# Patient Record
Sex: Male | Born: 1970 | Hispanic: No | Marital: Married | Smoking: Former smoker
Health system: Southern US, Community
[De-identification: ages and names within clinical notes are randomized; demographics above are authoritative.]

## PROBLEM LIST (undated history)

## (undated) DIAGNOSIS — N289 Disorder of kidney and ureter, unspecified: Secondary | ICD-10-CM

## (undated) DIAGNOSIS — I10 Essential (primary) hypertension: Secondary | ICD-10-CM

## (undated) DIAGNOSIS — K219 Gastro-esophageal reflux disease without esophagitis: Secondary | ICD-10-CM

## (undated) DIAGNOSIS — K2970 Gastritis, unspecified, without bleeding: Secondary | ICD-10-CM

## (undated) DIAGNOSIS — G43909 Migraine, unspecified, not intractable, without status migrainosus: Secondary | ICD-10-CM

## (undated) DIAGNOSIS — K8689 Other specified diseases of pancreas: Secondary | ICD-10-CM

## (undated) DIAGNOSIS — R519 Headache, unspecified: Secondary | ICD-10-CM

## (undated) DIAGNOSIS — R51 Headache: Secondary | ICD-10-CM

## (undated) HISTORY — PX: NO PAST SURGERIES: SHX2092

---

## 2014-06-14 ENCOUNTER — Encounter (HOSPITAL_COMMUNITY): Payer: Self-pay | Admitting: *Deleted

## 2014-06-14 ENCOUNTER — Emergency Department (INDEPENDENT_AMBULATORY_CARE_PROVIDER_SITE_OTHER)
Admission: EM | Admit: 2014-06-14 | Discharge: 2014-06-14 | Disposition: A | Discharge status: Home / Self Care | Payer: Medicaid Other | Source: Home / Self Care | Attending: Family Medicine | Admitting: Family Medicine

## 2014-06-14 DIAGNOSIS — N3090 Cystitis, unspecified without hematuria: Secondary | ICD-10-CM

## 2014-06-14 LAB — POCT URINALYSIS DIP (DEVICE)
BILIRUBIN URINE: NEGATIVE
Glucose, UA: NEGATIVE mg/dL
Ketones, ur: NEGATIVE mg/dL
Nitrite: POSITIVE — AB
PH: 5 (ref 5.0–8.0)
Protein, ur: 100 mg/dL — AB
Urobilinogen, UA: 0.2 mg/dL (ref 0.0–1.0)

## 2014-06-14 MED ORDER — CIPROFLOXACIN HCL 500 MG PO TABS: 500.0000 mg | ORAL_TABLET | Freq: Two times a day (BID) | ORAL | Status: DC

## 2014-06-14 NOTE — ED Notes (Signed)
Painful urination and bil. Flank pain x 4 days.  No fever.  From [place] -friend is translating.  No hx of same symptoms.  No hx. of kidney stones or UTI.

## 2014-06-14 NOTE — ED Provider Notes (Signed)
CSN: [id number]     Arrival date & time 06/14/14  1557 History   None    Chief Complaint  Patient presents with  . Urinary Tract Infection   (Consider location/radiation/quality/duration/timing/severity/associated sxs/prior Treatment) Patient is a 43 y.o. male presenting with urinary tract infection.  Urinary Tract Infection Associated symptoms include abdominal pain.           43 year old male presents for evaluation of possible UTI. He has lower abdominal pain, burning with urination, urinary frequency, urinary urgency. He has never had a urinary tract infection in the past but after discussing this with his friend they think that is what he has. Denies any penile discharge. No respiratory. No history of HIV or immune deficiency. He is a recent immigrant from [place]. He had one episode of vomiting 3 days ago, no fever, no diarrhea  History reviewed. No pertinent past medical history. History reviewed. No pertinent past surgical history. Family History  Problem Relation Age of Onset  . Hypertension Mother    History  Substance Use Topics  . Smoking status: Never Smoker   . Smokeless tobacco: Not on file  . Alcohol Use: 3.6 oz/week    6 Cans of beer per week    Review of Systems  Constitutional: Negative for fever and chills.  Gastrointestinal: Positive for vomiting and abdominal pain. Negative for nausea and diarrhea.  Genitourinary: Positive for dysuria, urgency and frequency. Negative for hematuria, discharge, penile swelling, penile pain and testicular pain.  All other systems reviewed and are negative.   Allergies  Review of patient's allergies indicates no known allergies.  Home Medications   Prior to Admission medications   Medication Sig Start Date End Date Taking? Authorizing Provider  ciprofloxacin (CIPRO) 500 MG tablet Take 1 tablet (500 mg total) by mouth every 12 (twelve) hours. 06/14/14   Freeman Caldron Shanequia Kendrick, PA-C   BP 164/91 mmHg  Pulse 84  Temp(Src) 98.8 F  (37.1 C) (Oral)  Resp 14  SpO2 98% Physical Exam  Constitutional: He is oriented to person, place, and time. He appears well-developed and well-nourished. No distress.  HENT:  Head: Normocephalic.  Pulmonary/Chest: Effort normal. No respiratory distress.  Abdominal: Normal appearance and bowel sounds are normal. He exhibits no ascites and no mass. There is no hepatosplenomegaly. There is no tenderness. There is no rigidity, no rebound, no guarding, no CVA tenderness, no tenderness at McBurney's point and negative Murphy's sign.  Neurological: He is alert and oriented to person, place, and time. Coordination normal.  Skin: Skin is warm and dry. No rash noted. He is not diaphoretic.  Psychiatric: He has a normal mood and affect. Judgment normal.  Nursing note and vitals reviewed.   ED Course  Procedures (including critical care time) Labs Review Labs Reviewed  POCT URINALYSIS DIP (DEVICE) - Abnormal; Notable for the following:    Hgb urine dipstick LARGE (*)    Protein, ur 100 (*)    Nitrite POSITIVE (*)    Leukocytes, UA LARGE (*)    All other components within normal limits  URINE CULTURE    Imaging Review No results found.   MDM   1. Cystitis    The physical exam is normal. He is afebrile, nontoxic. His urinalysis is consistent with a urinary tract infection. Treat with Cipro twice a day for 2 weeks for, gated UTI in a male. Urine culture has been sent. Follow-up when necessary   Meds ordered this encounter  Medications  . ciprofloxacin (CIPRO) 500 MG tablet  Sig: Take 1 tablet (500 mg total) by mouth every 12 (twelve) hours.    Dispense:  28 tablet    Refill:  0    Order Specific Question:  Supervising Provider    Answer:  Jake Michaelis, DAVID C [[id number]]       Liam Graham, PA-C 06/14/14 1740

## 2014-06-14 NOTE — Discharge Instructions (Signed)

## 2014-06-17 LAB — URINE CULTURE: Colony Count: >=100000

## 2014-06-17 NOTE — ED Notes (Signed)
Urine culture: >100,000 colonies E. Coli.  Pt. adequately treated with Cipro. Roselyn Meier 06/17/2014

## 2014-08-04 ENCOUNTER — Ambulatory Visit (INDEPENDENT_AMBULATORY_CARE_PROVIDER_SITE_OTHER): Payer: Medicaid Other | Admitting: Internal Medicine

## 2014-08-04 ENCOUNTER — Encounter: Payer: Self-pay | Admitting: Internal Medicine

## 2014-08-04 VITALS — BP 182/98 | HR 69 | Temp 97.9°F | Wt 172.7 lb

## 2014-08-04 DIAGNOSIS — R319 Hematuria, unspecified: Secondary | ICD-10-CM

## 2014-08-04 DIAGNOSIS — Z7189 Other specified counseling: Secondary | ICD-10-CM

## 2014-08-04 DIAGNOSIS — N390 Urinary tract infection, site not specified: Secondary | ICD-10-CM

## 2014-08-04 DIAGNOSIS — I10 Essential (primary) hypertension: Secondary | ICD-10-CM

## 2014-08-04 DIAGNOSIS — Z7689 Persons encountering health services in other specified circumstances: Secondary | ICD-10-CM

## 2014-08-04 LAB — CBC WITH DIFFERENTIAL/PLATELET
Basophils Absolute: 0.1 10*3/uL (ref 0.0–0.1)
Basophils Relative: 1 % (ref 0–1)
EOS ABS: 0.2 10*3/uL (ref 0.0–0.7)
EOS PCT: 3 % (ref 0–5)
HCT: 41.6 % (ref 39.0–52.0)
HEMOGLOBIN: 14.5 g/dL (ref 13.0–17.0)
LYMPHS ABS: 1.3 10*3/uL (ref 0.7–4.0)
LYMPHS PCT: 23 % (ref 12–46)
MCH: 31.5 pg (ref 26.0–34.0)
MCHC: 34.9 g/dL (ref 30.0–36.0)
MCV: 90.2 fL (ref 78.0–100.0)
MPV: 10.4 fL (ref 8.6–12.4)
Monocytes Absolute: 0.8 10*3/uL (ref 0.1–1.0)
Monocytes Relative: 14 % — ABNORMAL HIGH (ref 3–12)
Neutro Abs: 3.3 10*3/uL (ref 1.7–7.7)
Neutrophils Relative %: 59 % (ref 43–77)
PLATELETS: 194 10*3/uL (ref 150–400)
RBC: 4.61 MIL/uL (ref 4.22–5.81)
RDW: 14.3 % (ref 11.5–15.5)
WBC: 5.6 10*3/uL (ref 4.0–10.5)

## 2014-08-04 LAB — LIPID PANEL
CHOL/HDL RATIO: 2.5 ratio
Cholesterol: 171 mg/dL (ref 0–200)
HDL: 69 mg/dL (ref >39)
LDL Cholesterol: 93 mg/dL (ref 0–99)
Triglycerides: 46 mg/dL (ref <150)
VLDL: 9 mg/dL (ref 0–40)

## 2014-08-04 LAB — COMPREHENSIVE METABOLIC PANEL
ALBUMIN: 3.9 g/dL (ref 3.5–5.2)
ALK PHOS: 98 U/L (ref 39–117)
ALT: 28 U/L (ref 0–53)
AST: 23 U/L (ref 0–37)
BILIRUBIN TOTAL: 0.6 mg/dL (ref 0.2–1.2)
BUN: 14 mg/dL (ref 6–23)
CALCIUM: 9.3 mg/dL (ref 8.4–10.5)
CO2: 26 mEq/L (ref 19–32)
Chloride: 102 mEq/L (ref 96–112)
Creat: 1.04 mg/dL (ref 0.50–1.35)
Glucose, Bld: 94 mg/dL (ref 70–99)
Potassium: 4.7 mEq/L (ref 3.5–5.3)
Sodium: 135 mEq/L (ref 135–145)
Total Protein: 7.3 g/dL (ref 6.0–8.3)

## 2014-08-04 LAB — HEPATITIS PANEL, ACUTE
HCV AB: NEGATIVE
HEP B C IGM: NONREACTIVE
Hep A IgM: NONREACTIVE
Hepatitis B Surface Ag: NEGATIVE

## 2014-08-04 LAB — POCT URINALYSIS DIPSTICK
Bilirubin, UA: NEGATIVE
Blood, UA: NEGATIVE
Glucose, UA: NEGATIVE
KETONES UA: NEGATIVE
Leukocytes, UA: NEGATIVE
NITRITE UA: NEGATIVE
Protein, UA: NEGATIVE
SPEC GRAV UA: 1.025
UROBILINOGEN UA: 0.2
pH, UA: 5

## 2014-08-04 MED ORDER — HYDROCHLOROTHIAZIDE 25 MG PO TABS: 25.0000 mg | ORAL_TABLET | Freq: Every day | ORAL | Status: DC

## 2014-08-04 NOTE — Patient Instructions (Signed)
General Instructions:   Thank you for bringing your medicines today. This helps [place] keep you safe from mistakes.  We tested your urine and it looks like you cleared the infection. But since you took antibiotics we will look at it closer and call you if you need more medicine.   For your blood pressure we will start a new medicine an dfollow up with you in 2-3 wks for recheck.   We are getting some basic labs to see how your body is doing.   It was very nice to meet you.   Progress Toward Treatment Goals:  No flowsheet data found.  Self Care Goals & Plans:  No flowsheet data found.  No flowsheet data found.   Care Management & Community Referrals:  No flowsheet data found.

## 2014-08-04 NOTE — Progress Notes (Signed)
Case discussed with Dr. Sadek at the time of the visit.  We reviewed the resident's history and exam and pertinent patient test results.  I agree with the assessment, diagnosis, and plan of care documented in the resident's note. 

## 2014-08-04 NOTE — Assessment & Plan Note (Addendum)
Pt has never had access to medical care before today. Therefore will get some basic risk stratification labs. -CBC, CMet, TSH, lipid panel, HIV, hepatitis for screening

## 2014-08-04 NOTE — Assessment & Plan Note (Signed)
Patient recently grew out Escherichia coli that was sensitive to Cipro completed a two-week course. The patient stated he had almost complete resolution of all of his pain and other symptoms. He still has some intermittent left flank tenderness but no hematuria or dysuria. Urine dip today in clinic was negative for infection or hematuria. -Urine micro-, urine culture -May need to consider kidney stones as a possible etiology and when patient follows up in 2 weeks for blood pressure recheck would reassess for need of CT imaging
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
## 2014-08-04 NOTE — Assessment & Plan Note (Signed)
BP Readings from Last 3 Encounters:  08/04/14 182/98  06/14/14 164/91    No results found for: NA, K, CREATININE  Assessment: Blood pressure control:   Progress toward BP goal:    Comments: elevated pt has never had medical care  Plan: Medications:  will start HCTZ 25mg  q d and follow up in 2-3 wks for blood work and titration Educational resources provided:   Estate manager/land agent provided:   Other plans: will obtain some basic labs: Cmet

## 2014-08-05 LAB — URINALYSIS, ROUTINE W REFLEX MICROSCOPIC
BILIRUBIN URINE: NEGATIVE
Glucose, UA: NEGATIVE mg/dL
HGB URINE DIPSTICK: NEGATIVE
KETONES UR: NEGATIVE mg/dL
LEUKOCYTES UA: NEGATIVE
Nitrite: NEGATIVE
Protein, ur: NEGATIVE mg/dL
Specific Gravity, Urine: 1.014 (ref 1.005–1.030)
UROBILINOGEN UA: 0.2 mg/dL (ref 0.0–1.0)
pH: 5 (ref 5.0–8.0)

## 2014-08-05 LAB — HIV ANTIBODY (ROUTINE TESTING W REFLEX): HIV 1&2 Ab, 4th Generation: NONREACTIVE

## 2014-08-05 LAB — URINE CULTURE
Colony Count: NO GROWTH
Organism ID, Bacteria: NO GROWTH

## 2014-08-05 LAB — TSH: TSH: 1.18 u[IU]/mL (ref 0.350–4.500)

## 2014-08-17 ENCOUNTER — Telehealth: Payer: Self-pay | Admitting: Internal Medicine

## 2014-08-17 NOTE — Telephone Encounter (Signed)
Call to patient to confirm appointment for 08/18/14 at 9:45. Called with interpreter phone does not accept incoming calls

## 2014-08-18 ENCOUNTER — Ambulatory Visit (HOSPITAL_COMMUNITY)
Admission: RE | Admit: 2014-08-18 | Discharge: 2014-08-18 | Disposition: A | Discharge status: Home / Self Care | Payer: Medicaid Other | Source: Ambulatory Visit | Attending: Internal Medicine | Admitting: Internal Medicine

## 2014-08-18 ENCOUNTER — Ambulatory Visit (INDEPENDENT_AMBULATORY_CARE_PROVIDER_SITE_OTHER): Payer: Medicaid Other | Admitting: Internal Medicine

## 2014-08-18 VITALS — BP 133/77 | HR 82 | Temp 98.5°F | Wt 162.4 lb

## 2014-08-18 DIAGNOSIS — R918 Other nonspecific abnormal finding of lung field: Secondary | ICD-10-CM | POA: Diagnosis not present

## 2014-08-18 DIAGNOSIS — I10 Essential (primary) hypertension: Secondary | ICD-10-CM

## 2014-08-18 DIAGNOSIS — R042 Hemoptysis: Secondary | ICD-10-CM | POA: Diagnosis not present

## 2014-08-18 DIAGNOSIS — J189 Pneumonia, unspecified organism: Secondary | ICD-10-CM

## 2014-08-18 MED ORDER — LEVOFLOXACIN 500 MG PO TABS: 500.0000 mg | ORAL_TABLET | Freq: Every day | ORAL | Status: AC

## 2014-08-18 MED ORDER — GUAIFENESIN-DM 100-10 MG/5ML PO SYRP: 5.0000 mL | ORAL_SOLUTION | ORAL | Status: DC | PRN

## 2014-08-18 NOTE — Progress Notes (Signed)
   Subjective:   Patient ID: Danta Baumgardner male   DOB: [birth year] 44 y.o.   MRN: [id number]  HPI: Mr.Sony Hailu Ledyard is a 44 y.o. [place] refugee originally from [place] who presents for hypertension recheck.  Hypertension ROS: taking medications as instructed, no medication side effects noted, no TIA's, no chest pain on exertion, no dyspnea on exertion and no swelling of ankles.   New concerns: The patient states that over the past 2 weeks he has had ongoing productive cough with sputum and blood. He describes the blood as speckles on the tissue and sometimes streaked within the sputum but no gross large volume blood. 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 He denies any night sweats, fever, sick contacts, or weight loss. He has just had some overall fatigue and malaise but maintained normal appetite and activity.     No past medical history on file. Current Outpatient Prescriptions  Medication Sig Dispense Refill  . guaiFENesin-dextromethorphan (ROBITUSSIN DM) 100-10 MG/5ML syrup Take 5 mLs by mouth every 4 (four) hours as needed for cough. 118 mL 0  . hydrochlorothiazide (HYDRODIURIL) 25 MG tablet Take 1 tablet (25 mg total) by mouth daily. 30 tablet 2  . levofloxacin (LEVAQUIN) 500 MG tablet Take 1 tablet (500 mg total) by mouth daily. 10 tablet 0   No current facility-administered medications for this visit.   Family History  Problem Relation Age of Onset  . Hypertension Mother    History   Social History  . Marital Status: Single    Spouse Name: N/A  . Number of Children: N/A  . Years of Education: N/A   Social History Main Topics  . Smoking status: Former Research scientist (life sciences)  . Smokeless tobacco: Not on file  . Alcohol Use: 3.6 oz/week    6 Cans of beer per week  . Drug Use: No  . Sexual Activity: Not on file   Other Topics Concern  . Not on file   Social  History Narrative   Review of Systems: Pertinent items are noted in HPI. Objective:  Physical Exam: Filed Vitals:   08/18/14 0958  BP: 133/77  Pulse: 82  Temp: 98.5 F (36.9 C)  TempSrc: Oral  Weight: 162 lb 6.4 oz (73.664 kg)  SpO2: 97%   General: sitting in chair, NAD HEENT:no scleral icterus, injected conjunctivae bilaterally  Cardiac: RRR, no rubs, murmurs or gallops Pulm: some LLL crackles, no wheezes, or rhonchi, moving normal volumes of air Abd: soft, nontender, nondistended, BS present Ext: warm and well perfused, no pedal edema Neuro: alert and oriented X3, cranial nerves II-XII grossly intact  Assessment & Plan:  Please see problem oriented charting  Pt discussed with Dr. Eppie Gibson

## 2014-08-18 NOTE — Assessment & Plan Note (Signed)
Given the history that the patient is TB spot positive and now developing symptoms over the past 1-2 weeks is concerning for active TB area the patient has no known exposure to anyone else with TB who he emigrated with. Personal phone call was made to the Department of Health where supplemental information was obtained that the patient had been seen earlier this week on 08/16/14 and had presented with a nonproductive cough at that time and no other symptoms. His HIV status as of 12/15 was negative. This is reassuring and making active TB less likely.  -CXR showed some superior aspect of upper lobe streaking which would be an atypical presentation most likely in IC pts for active TB -therefore will treat for CAP with levofloxacin 500mg  qd x10 d -personal phone call to health department update on chest x-ray findings and information from this visit was forwarded to the M.D. there for review in terms of treatment

## 2014-08-18 NOTE — Assessment & Plan Note (Signed)
BP Readings from Last 3 Encounters:  08/18/14 133/77  08/04/14 182/98  06/14/14 164/91    Lab Results  Component Value Date   NA 135 08/04/2014   K 4.7 08/04/2014   CREATININE 1.04 08/04/2014    Assessment: Blood pressure control:   Progress toward BP goal:    Comments: well controlled  Plan: Medications:  continue HCTZ 25mg   Educational resources provided: brochure (denies) Self management tools provided:   Other plans: f/u in 3-6 mo

## 2014-08-19 NOTE — Progress Notes (Signed)
Case discussed with Dr. Algis Liming at time of visit.  We reviewed the resident's history and exam and pertinent patient test results.  I agree with the assessment, diagnosis, and plan of care documented in the resident's note with the following modifications:  The CXR demonstrated a patchy infiltrate in the superior segment of the left lower lobe.  In someone who is HIV negative, this would be an unusual place for an infiltrate secondary to Tb.  Given the lack of other constitutional symptoms, I am leaning towards this infiltrate and streaky hemoptysis representing a community acquired pneumonia of the superior segment of the left lower lobe rather than active Tb.  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  They may also want to obtain sputum for AFB stain and culture.  We will defer this decision to the Health Department since they are already involved in his care.  If the streaky hemoptysis does not improve with treatment for a community acquired pneumonia and the infiltrate persists after 4-6 weeks, the differential will need to be broadened given his country of origin.

## 2014-11-08 NOTE — Addendum Note (Signed)
Addended by: Orson Gear on: 11/08/2014 02:08 PM   Modules accepted: Orders

## 2014-11-27 ENCOUNTER — Emergency Department (HOSPITAL_COMMUNITY)
Admission: EM | Admit: 2014-11-27 | Discharge: 2014-11-28 | Disposition: A | Discharge status: Home / Self Care | Payer: Medicaid Other | Attending: Emergency Medicine | Admitting: Emergency Medicine

## 2014-11-27 ENCOUNTER — Emergency Department (HOSPITAL_COMMUNITY): Payer: Medicaid Other

## 2014-11-27 ENCOUNTER — Encounter (HOSPITAL_COMMUNITY): Payer: Self-pay | Admitting: Emergency Medicine

## 2014-11-27 DIAGNOSIS — Y939 Activity, unspecified: Secondary | ICD-10-CM | POA: Diagnosis not present

## 2014-11-27 DIAGNOSIS — S3992XA Unspecified injury of lower back, initial encounter: Secondary | ICD-10-CM | POA: Insufficient documentation

## 2014-11-27 DIAGNOSIS — I10 Essential (primary) hypertension: Secondary | ICD-10-CM | POA: Diagnosis not present

## 2014-11-27 DIAGNOSIS — S3991XA Unspecified injury of abdomen, initial encounter: Secondary | ICD-10-CM | POA: Diagnosis not present

## 2014-11-27 DIAGNOSIS — Z79899 Other long term (current) drug therapy: Secondary | ICD-10-CM | POA: Diagnosis not present

## 2014-11-27 DIAGNOSIS — Y999 Unspecified external cause status: Secondary | ICD-10-CM | POA: Diagnosis not present

## 2014-11-27 DIAGNOSIS — Z87891 Personal history of nicotine dependence: Secondary | ICD-10-CM | POA: Diagnosis not present

## 2014-11-27 DIAGNOSIS — S0990XA Unspecified injury of head, initial encounter: Secondary | ICD-10-CM | POA: Diagnosis not present

## 2014-11-27 DIAGNOSIS — Y92410 Unspecified street and highway as the place of occurrence of the external cause: Secondary | ICD-10-CM | POA: Insufficient documentation

## 2014-11-27 DIAGNOSIS — S299XXA Unspecified injury of thorax, initial encounter: Secondary | ICD-10-CM | POA: Insufficient documentation

## 2014-11-27 DIAGNOSIS — S199XXA Unspecified injury of neck, initial encounter: Secondary | ICD-10-CM | POA: Insufficient documentation

## 2014-11-27 DIAGNOSIS — R52 Pain, unspecified: Secondary | ICD-10-CM

## 2014-11-27 HISTORY — DX: Essential (primary) hypertension: I10

## 2014-11-27 LAB — BASIC METABOLIC PANEL
Anion gap: 9 (ref 5–15)
BUN: 9 mg/dL (ref 6–20)
CALCIUM: 8.8 mg/dL — AB (ref 8.9–10.3)
CO2: 25 mmol/L (ref 22–32)
Chloride: 103 mmol/L (ref 101–111)
Creatinine, Ser: 1.17 mg/dL (ref 0.61–1.24)
GFR calc Af Amer: >60 mL/min (ref >60)
Glucose, Bld: 143 mg/dL — ABNORMAL HIGH (ref 65–99)
Potassium: 3.8 mmol/L (ref 3.5–5.1)
Sodium: 137 mmol/L (ref 135–145)

## 2014-11-27 LAB — CBC WITH DIFFERENTIAL/PLATELET
BASOS ABS: 0 10*3/uL (ref 0.0–0.1)
BASOS PCT: 0 % (ref 0–1)
EOS ABS: 0.1 10*3/uL (ref 0.0–0.7)
Eosinophils Relative: 2 % (ref 0–5)
HCT: 45.9 % (ref 39.0–52.0)
HEMOGLOBIN: 15.8 g/dL (ref 13.0–17.0)
LYMPHS ABS: 1 10*3/uL (ref 0.7–4.0)
LYMPHS PCT: 13 % (ref 12–46)
MCH: 31.3 pg (ref 26.0–34.0)
MCHC: 34.4 g/dL (ref 30.0–36.0)
MCV: 90.9 fL (ref 78.0–100.0)
Monocytes Absolute: 0.7 10*3/uL (ref 0.1–1.0)
Monocytes Relative: 9 % (ref 3–12)
NEUTROS ABS: 5.8 10*3/uL (ref 1.7–7.7)
Neutrophils Relative %: 76 % (ref 43–77)
Platelets: 203 10*3/uL (ref 150–400)
RBC: 5.05 MIL/uL (ref 4.22–5.81)
RDW: 13.4 % (ref 11.5–15.5)
WBC: 7.7 10*3/uL (ref 4.0–10.5)

## 2014-11-27 MED ORDER — MORPHINE SULFATE 4 MG/ML IJ SOLN
6.0000 mg | Freq: Once | INTRAMUSCULAR | Status: AC
Start: 2014-11-27 — End: 2014-11-27
  Administered 2014-11-27: 6 mg via INTRAVENOUS
  Filled 2014-11-27: qty 2

## 2014-11-27 MED ORDER — OXYCODONE-ACETAMINOPHEN 5-325 MG PO TABS: 1.0000 | ORAL_TABLET | Freq: Four times a day (QID) | ORAL | Status: DC | PRN

## 2014-11-27 MED ORDER — MORPHINE SULFATE 4 MG/ML IJ SOLN
4.0000 mg | Freq: Once | INTRAMUSCULAR | Status: AC
  Administered 2014-11-27: 4 mg via INTRAVENOUS
  Filled 2014-11-27: qty 1

## 2014-11-27 MED ORDER — HYDROCODONE-ACETAMINOPHEN 5-325 MG PO TABS
2.0000 | ORAL_TABLET | Freq: Once | ORAL | Status: AC
  Administered 2014-11-27: 2 via ORAL
  Filled 2014-11-27: qty 2

## 2014-11-27 MED ORDER — SODIUM CHLORIDE 0.9 % IV SOLN
INTRAVENOUS | Status: DC
  Administered 2014-11-27: 18:00:00 via INTRAVENOUS

## 2014-11-27 MED ORDER — MORPHINE SULFATE 4 MG/ML IJ SOLN
6.0000 mg | Freq: Once | INTRAMUSCULAR | Status: AC
  Administered 2014-11-27: 6 mg via INTRAVENOUS
  Filled 2014-11-27: qty 2

## 2014-11-27 MED ORDER — IOHEXOL 300 MG/ML  SOLN
100.0000 mL | Freq: Once | INTRAMUSCULAR | Status: AC | PRN
  Administered 2014-11-27: 100 mL via INTRAVENOUS

## 2014-11-27 NOTE — ED Notes (Signed)
Pt requesting water PO; pt given green mouth swab to moisten mouth; pt advised not to drink at this time until MD reviews scans
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
## 2014-11-27 NOTE — ED Notes (Signed)
This RN ambulated with the pt. While ambulating, through the translator, pt reports pain in his shoulder, and that he is feeling dizzy and short of breath.
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
## 2014-11-27 NOTE — Discharge Instructions (Signed)
Rib Fracture  You have abnormal lymph glands in your abdomen seen on today's CT scan. Call the [facility] on Tuesday, 11/30/2014 to schedule the next available appointment. You need to be checked for cancer and other causes of abnormal lymph glands. A rib fracture is a break or crack in one of the bones of the ribs. The ribs are a group of long, curved bones that wrap around your chest and attach to your spine. They protect your lungs and other organs in the chest cavity. A broken or cracked rib is often painful, but most do not cause other problems. Most rib fractures heal on their own over time. However, rib fractures can be more serious if multiple ribs are broken or if broken ribs move out of place and push against other structures. CAUSES   A direct blow to the chest. For example, this could happen during contact sports, a car accident, or a fall against a hard object.  Repetitive movements with high force, such as pitching a baseball or having severe coughing spells. SYMPTOMS   Pain when you breathe in or cough.  Pain when someone presses on the injured area. DIAGNOSIS  Your caregiver will perform a physical exam. Various imaging tests may be ordered to confirm the diagnosis and to look for related injuries. These tests may include a chest X-ray, computed tomography (CT), magnetic resonance imaging (MRI), or a bone scan. TREATMENT  Rib fractures usually heal on their own in 1-3 months. The longer healing period is often associated with a continued cough or other aggravating activities. During the healing period, pain control is very important. Medication is usually given to control pain. Hospitalization or surgery may be needed for more severe injuries, such as those in which multiple ribs are broken or the ribs have moved out of place.  HOME CARE INSTRUCTIONS   Avoid strenuous activity and any activities or movements that cause pain. Be careful during activities and  avoid bumping the injured rib.  Gradually increase activity as directed by your caregiver.  Only take over-the-counter or prescription medications as directed by your caregiver. Do not take other medications without asking your caregiver first.  Apply ice to the injured area for the first 1-2 days after you have been treated or as directed by your caregiver. Applying ice helps to reduce inflammation and pain.  Put ice in a plastic bag.  Place a towel between your skin and the bag.   Leave the ice on for 15-20 minutes at a time, every 2 hours while you are awake.  Perform deep breathing as directed by your caregiver. This will help prevent pneumonia, which is a common complication of a broken rib. Your caregiver may instruct you to:  Take deep breaths several times a day.  Try to cough several times a day, holding a pillow against the injured area.  Use a device called an incentive spirometer to practice deep breathing several times a day.  Drink enough fluids to keep your urine clear or pale yellow. This will help you avoid constipation.   Do not wear a rib belt or binder. These restrict breathing, which can lead to pneumonia.  SEEK IMMEDIATE MEDICAL CARE IF:   You have a fever.   You have difficulty breathing or shortness of breath.   You develop a continual cough, or you cough up thick or bloody sputum.  You feel sick to your stomach (nausea), throw up (vomit), or have abdominal pain.  You have worsening pain not controlled with medications.  MAKE SURE YOU:  Understand these instructions.  Will watch your condition.  Will get help right away if you are not doing well or get worse. Document Released: 06/18/2005 Document Revised: 02/18/2013 Document Reviewed: 08/20/2012 [facility] Patient Information 2015 [place], [place]. This information is not intended to replace advice given to you by your health care provider. Make sure you discuss any questions you have with  your health care provider.
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
## 2014-11-30 ENCOUNTER — Ambulatory Visit
Admission: RE | Admit: 2014-11-30 | Discharge: 2014-11-30 | Disposition: A | Discharge status: Home / Self Care | Payer: No Typology Code available for payment source | Source: Ambulatory Visit | Attending: Infectious Disease | Admitting: Infectious Disease

## 2014-11-30 ENCOUNTER — Other Ambulatory Visit: Payer: Self-pay | Admitting: Infectious Disease

## 2014-11-30 DIAGNOSIS — Z111 Encounter for screening for respiratory tuberculosis: Secondary | ICD-10-CM

## 2014-12-02 ENCOUNTER — Encounter: Payer: Self-pay | Admitting: Family Medicine

## 2014-12-02 ENCOUNTER — Ambulatory Visit: Payer: No Typology Code available for payment source | Attending: Family Medicine | Admitting: Family Medicine

## 2014-12-02 VITALS — BP 188/97 | HR 84 | Temp 98.0°F | Resp 16 | Wt 172.0 lb

## 2014-12-02 DIAGNOSIS — W2212XD Striking against or struck by front passenger side automobile airbag, subsequent encounter: Secondary | ICD-10-CM | POA: Insufficient documentation

## 2014-12-02 DIAGNOSIS — S2249XD Multiple fractures of ribs, unspecified side, subsequent encounter for fracture with routine healing: Secondary | ICD-10-CM | POA: Diagnosis not present

## 2014-12-02 DIAGNOSIS — I10 Essential (primary) hypertension: Secondary | ICD-10-CM | POA: Insufficient documentation

## 2014-12-02 DIAGNOSIS — M5489 Other dorsalgia: Secondary | ICD-10-CM | POA: Diagnosis not present

## 2014-12-02 DIAGNOSIS — S2242XA Multiple fractures of ribs, left side, initial encounter for closed fracture: Secondary | ICD-10-CM

## 2014-12-02 DIAGNOSIS — R0789 Other chest pain: Secondary | ICD-10-CM | POA: Insufficient documentation

## 2014-12-02 MED ORDER — HYDROCHLOROTHIAZIDE 25 MG PO TABS
25.0000 mg | ORAL_TABLET | Freq: Every day | ORAL | Status: DC
Start: 2014-12-02 — End: 2015-08-24

## 2014-12-02 MED ORDER — TRAMADOL HCL 50 MG PO TABS: 50.0000 mg | ORAL_TABLET | Freq: Three times a day (TID) | ORAL | Status: DC | PRN
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
## 2014-12-13 ENCOUNTER — Telehealth: Payer: Self-pay | Admitting: Family Medicine

## 2014-12-13 ENCOUNTER — Other Ambulatory Visit: Payer: Self-pay | Admitting: Family Medicine

## 2014-12-13 DIAGNOSIS — R599 Enlarged lymph nodes, unspecified: Secondary | ICD-10-CM

## 2014-12-15 ENCOUNTER — Telehealth: Payer: Self-pay

## 2014-12-15 NOTE — Telephone Encounter (Signed)
Nurse called patient, via [employer] [phone]. Interpretor attempted calling number twice.  Number is invalid number.

## 2014-12-21 ENCOUNTER — Ambulatory Visit: Payer: Self-pay

## 2014-12-21 NOTE — Telephone Encounter (Signed)
Nurse called patient, via [employer], Keith Hart. Interpreter reports invalid phone number. Patient has appointment in clinic today at 2pm. Nurse will speak to patient at appointment time.

## 2014-12-22 ENCOUNTER — Ambulatory Visit: Payer: Self-pay

## 2014-12-22 ENCOUNTER — Ambulatory Visit (HOSPITAL_COMMUNITY): Admission: RE | Admit: 2014-12-22 | Payer: No Typology Code available for payment source | Source: Ambulatory Visit

## 2014-12-24 ENCOUNTER — Ambulatory Visit: Payer: Self-pay

## 2014-12-27 ENCOUNTER — Telehealth: Payer: Self-pay

## 2014-12-27 NOTE — Telephone Encounter (Signed)
Nurse spoke to [place] at the [facility]. [name] will talk to patient and make him aware of appointment at [facility] on Friday, December 31, 2014 for PET scan. [name] will explain to patient appointment is at 10am, patient needs to arrive at 9:30am, Patient can not eat or drink anything for 6 hours prior to procedure. Vonda agrees to call nurse with any questions.

## 2014-12-30 NOTE — Telephone Encounter (Signed)
Nurse received message from Keith Hart at [facility] Department requesting nurse to cancel PET scan. Patient has no transportation.  Nurse canceled PET scan. Did not reschedule as this is the second cancellation. Nurse will speak with someone at [facility] to see if transportation can be provided for patient.  Nurse will call [name] back with more information.

## 2014-12-31 ENCOUNTER — Ambulatory Visit (HOSPITAL_COMMUNITY): Admission: RE | Admit: 2014-12-31 | Payer: Self-pay | Source: Ambulatory Visit

## 2015-01-21 ENCOUNTER — Telehealth: Payer: Self-pay

## 2015-01-21 NOTE — Telephone Encounter (Signed)
Nurse left message for [facility] Department to return nurse's telephone call to discuss transportation issues.

## 2015-01-26 NOTE — Telephone Encounter (Signed)
Nurse called Tilda Burrow with the health department, as requested by [facility]. Reached voicemail. Left message for patient to call Nira Conn with [facility], at [phone] to set patient up with appointment and transportation.

## 2015-01-27 NOTE — Telephone Encounter (Signed)
Nurse called Tilda Burrow at health department, reached voicemail. Left message for Cherlyn Cushing to call Nira Conn with [facility], at [phone] to set up appointment for PET scan and transportation

## 2015-01-29 ENCOUNTER — Encounter (HOSPITAL_COMMUNITY): Payer: Self-pay | Admitting: *Deleted

## 2015-01-29 ENCOUNTER — Emergency Department (HOSPITAL_COMMUNITY)
Admission: EM | Admit: 2015-01-29 | Discharge: 2015-01-29 | Disposition: A | Discharge status: Home / Self Care | Payer: No Typology Code available for payment source | Attending: Emergency Medicine | Admitting: Emergency Medicine

## 2015-01-29 ENCOUNTER — Emergency Department (HOSPITAL_COMMUNITY): Payer: No Typology Code available for payment source

## 2015-01-29 DIAGNOSIS — I10 Essential (primary) hypertension: Secondary | ICD-10-CM | POA: Insufficient documentation

## 2015-01-29 DIAGNOSIS — Z79899 Other long term (current) drug therapy: Secondary | ICD-10-CM | POA: Insufficient documentation

## 2015-01-29 DIAGNOSIS — R300 Dysuria: Secondary | ICD-10-CM | POA: Diagnosis not present

## 2015-01-29 DIAGNOSIS — R319 Hematuria, unspecified: Secondary | ICD-10-CM | POA: Insufficient documentation

## 2015-01-29 DIAGNOSIS — Z87828 Personal history of other (healed) physical injury and trauma: Secondary | ICD-10-CM | POA: Diagnosis not present

## 2015-01-29 DIAGNOSIS — R109 Unspecified abdominal pain: Secondary | ICD-10-CM | POA: Diagnosis not present

## 2015-01-29 DIAGNOSIS — Z87891 Personal history of nicotine dependence: Secondary | ICD-10-CM | POA: Insufficient documentation

## 2015-01-29 LAB — URINE MICROSCOPIC-ADD ON

## 2015-01-29 LAB — CBC WITH DIFFERENTIAL/PLATELET
BASOS PCT: 0 % (ref 0–1)
Basophils Absolute: 0 10*3/uL (ref 0.0–0.1)
EOS ABS: 0.2 10*3/uL (ref 0.0–0.7)
Eosinophils Relative: 1 % (ref 0–5)
HCT: 41.3 % (ref 39.0–52.0)
HEMOGLOBIN: 14 g/dL (ref 13.0–17.0)
Lymphocytes Relative: 14 % (ref 12–46)
Lymphs Abs: 1.5 10*3/uL (ref 0.7–4.0)
MCH: 30.8 pg (ref 26.0–34.0)
MCHC: 33.9 g/dL (ref 30.0–36.0)
MCV: 90.8 fL (ref 78.0–100.0)
MONOS PCT: 7 % (ref 3–12)
Monocytes Absolute: 0.8 10*3/uL (ref 0.1–1.0)
NEUTROS ABS: 8.7 10*3/uL — AB (ref 1.7–7.7)
NEUTROS PCT: 78 % — AB (ref 43–77)
PLATELETS: 178 10*3/uL (ref 150–400)
RBC: 4.55 MIL/uL (ref 4.22–5.81)
RDW: 12.9 % (ref 11.5–15.5)
WBC: 11.3 10*3/uL — ABNORMAL HIGH (ref 4.0–10.5)

## 2015-01-29 LAB — URINALYSIS, ROUTINE W REFLEX MICROSCOPIC
Bilirubin Urine: NEGATIVE
GLUCOSE, UA: NEGATIVE mg/dL
Ketones, ur: NEGATIVE mg/dL
Nitrite: NEGATIVE
PH: 5.5 (ref 5.0–8.0)
PROTEIN: 30 mg/dL — AB
Specific Gravity, Urine: 1.013 (ref 1.005–1.030)
UROBILINOGEN UA: 1 mg/dL (ref 0.0–1.0)

## 2015-01-29 LAB — BASIC METABOLIC PANEL
Anion gap: 7 (ref 5–15)
BUN: 12 mg/dL (ref 6–20)
CO2: 23 mmol/L (ref 22–32)
Calcium: 8.6 mg/dL — ABNORMAL LOW (ref 8.9–10.3)
Chloride: 104 mmol/L (ref 101–111)
Creatinine, Ser: 1.09 mg/dL (ref 0.61–1.24)
GFR calc Af Amer: >60 mL/min (ref >60)
GLUCOSE: 118 mg/dL — AB (ref 65–99)
Potassium: 3.1 mmol/L — ABNORMAL LOW (ref 3.5–5.1)
SODIUM: 134 mmol/L — AB (ref 135–145)

## 2015-01-29 MED ORDER — CIPROFLOXACIN HCL 500 MG PO TABS
500.0000 mg | ORAL_TABLET | Freq: Once | ORAL | Status: AC
  Administered 2015-01-29: 500 mg via ORAL
  Filled 2015-01-29: qty 1

## 2015-01-29 MED ORDER — KETOROLAC TROMETHAMINE 30 MG/ML IJ SOLN
30.0000 mg | Freq: Once | INTRAMUSCULAR | Status: AC
  Administered 2015-01-29: 30 mg via INTRAVENOUS
  Filled 2015-01-29: qty 1

## 2015-01-29 MED ORDER — HYDROCODONE-ACETAMINOPHEN 5-325 MG PO TABS: 1.0000 | ORAL_TABLET | Freq: Four times a day (QID) | ORAL | Status: DC | PRN

## 2015-01-29 MED ORDER — CIPROFLOXACIN HCL 500 MG PO TABS: 500.0000 mg | ORAL_TABLET | Freq: Two times a day (BID) | ORAL | Status: DC

## 2015-01-29 MED ORDER — SODIUM CHLORIDE 0.9 % IV BOLUS (SEPSIS)
1000.0000 mL | Freq: Once | INTRAVENOUS | Status: AC
  Administered 2015-01-29: 1000 mL via INTRAVENOUS

## 2015-01-29 NOTE — ED Notes (Signed)
Pt reports that he has rt flank pain and pain with urination. Pt has noticed blood in urine as well.

## 2015-01-29 NOTE — Discharge Instructions (Signed)
Flank Pain °Flank pain is pain in your side. The flank is the area of your side between your upper belly (abdomen) and your back. Pain in this area can be caused by many different things. °HOME CARE °Home care and treatment will depend on the cause of your pain. °· Rest as told by your doctor. °· Drink enough fluids to keep your pee (urine) clear or pale yellow.   °· Only take medicine as told by your doctor. °· Tell your doctor about any changes in your pain. °· Follow up with your doctor. °GET HELP RIGHT AWAY IF:  °· Your pain does not get better with medicine.   °· You have new symptoms or your symptoms get worse. °· Your pain gets worse.   °· You have belly (abdominal) pain.   °· You are short of breath.   °· You always feel sick to your stomach (nauseous).   °· You keep throwing up (vomiting).   °· You have puffiness (swelling) in your belly.   °· You feel light-headed or you pass out (faint).   °· You have blood in your pee. °· You have a fever or lasting symptoms for more than 2-3 days. °· You have a fever and your symptoms suddenly get worse. °MAKE SURE YOU:  °· Understand these instructions. °· Will watch your condition. °· Will get help right away if you are not doing well or get worse. °Document Released: 03/27/2008 Document Revised: 11/02/2013 Document Reviewed: 01/31/2012 °ExitCare® Patient Information ©2015 [employer]. This information is not intended to replace advice given to you by your health care provider. Make sure you discuss any questions you have with your health care provider. ° °

## 2015-01-29 NOTE — ED Notes (Signed)
Pt returned from CT °
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
## 2015-01-29 NOTE — ED Notes (Signed)
MD AT BEDSIDE 

## 2015-01-29 NOTE — ED Notes (Signed)
Pt. Is going to CT. 

## 2015-01-31 NOTE — Telephone Encounter (Signed)
Nurse called Tilda Burrow at health departemtn, reached voicemail. Left message for Cherlyn Cushing to return call to [facility] at [phone] until 1pm. Nurse left number [phone] for [name] to return call if it's after 2pm today and request to speak to [facility].

## 2015-02-01 NOTE — Telephone Encounter (Signed)
Nurse called Tilda Burrow at health department, reached voicemail. Left message for [name] to return call to [facility] with [facility] at [phone].

## 2015-02-02 NOTE — Telephone Encounter (Signed)
Nurse called to reschedule PET scan for early morning appointment. Appointment rescheduled at [facility] for February 17, 2015 at 8am, patient is to arrive at 7:30am. Patient can not have anything to eat or drink after midnight prior to test. Nurse left message with [facility] financial department to see if patient needs to bring money with him to appointment. Nurse will call Vicky at [facility] Department when phone call about finances is returned.

## 2015-02-02 NOTE — Telephone Encounter (Signed)
Nurse spoke with Keith Hart in financial. Uninsured price for PET scan is $2395.80. Patient should bring as much money as he can at time of scan. Per Keith Hart, they will be him for remainder or if patient can not afford any payment, total amount can be billed.

## 2015-02-02 NOTE — Telephone Encounter (Signed)
Nurse spoke with Olegario Shearer at health department. Olegario Shearer is aware of PET scan costing $2395.80 and recommendations of bringing as much as patient can afford. Per Asencion Partridge at [facility] she will work with patient when he is available to see about getting an orange card and [facility] discount. Vicky agrees to speak to patient about appointment and financial requirements and get back with nurse.

## 2015-02-02 NOTE — Telephone Encounter (Signed)
Keith Hart called nurse. Nurse informed Keith Hart of need for patient to have PET scan. Nurse explained [name] requested nurse to speak with [name] to coordinate transportation for patient. [name] agrees to speak with patient to verify patients address and to see what days and times work for patient. [name] returned call to nurse explaining patient is available "this Friday at [place]". Nurse called to schedule PET scan. Nothing is available this Friday at [place]. Nurse scheduled appointment for Friday, August 12 at 1pm, patient is to arrive at 12:30pm and can not have anything to eat or drink 6 hours prior to the procedure. [name] understands details and will call patient. [name] agrees to call nurse if this is not convenient for patient. Nurse explained importance of patient needing scan and if this appointment is not okay with patient, finding out a range of days patient is available. [name] agrees and will call nurse back.

## 2015-02-02 NOTE — Telephone Encounter (Signed)
Haynes Dage supervisor, called nurse. Per Olegario Shearer, patient needs early morning appointment. Nurse will call and reschedule appointment and return call to [facility].

## 2015-02-07 NOTE — Telephone Encounter (Signed)
Nurse called Olegario Shearer at [facility] Department. Per Olegario Shearer, message was left with details of PET scan appointment and need to come to office for financial appointment. Patient ahs not returned call to [facility]. Vicky agrees to call patient again today and return call to [place] at [facility].

## 2015-02-10 NOTE — Telephone Encounter (Signed)
Vicky with health department called to find out if patient came to financial appointment this week. [place] had scheduled transportation to financial appt at [facility]. Patient did not show up.  Reached Vicky's voicemail. Left message for Olegario Shearer to return call to [facility] at [phone].

## 2015-02-11 ENCOUNTER — Ambulatory Visit (HOSPITAL_COMMUNITY): Payer: No Typology Code available for payment source

## 2015-02-17 ENCOUNTER — Ambulatory Visit (HOSPITAL_COMMUNITY): Payer: Self-pay

## 2015-03-18 ENCOUNTER — Ambulatory Visit: Payer: Self-pay | Admitting: Internal Medicine

## 2015-03-18 ENCOUNTER — Encounter: Payer: Self-pay | Admitting: Internal Medicine

## 2015-08-23 ENCOUNTER — Emergency Department (HOSPITAL_COMMUNITY)
Admission: EM | Admit: 2015-08-23 | Discharge: 2015-08-24 | Disposition: A | Discharge status: Home / Self Care | Payer: BLUE CROSS/BLUE SHIELD | Attending: Emergency Medicine | Admitting: Emergency Medicine

## 2015-08-23 ENCOUNTER — Encounter (HOSPITAL_COMMUNITY): Payer: Self-pay | Admitting: Emergency Medicine

## 2015-08-23 DIAGNOSIS — I10 Essential (primary) hypertension: Secondary | ICD-10-CM | POA: Diagnosis not present

## 2015-08-23 DIAGNOSIS — Z87891 Personal history of nicotine dependence: Secondary | ICD-10-CM | POA: Diagnosis not present

## 2015-08-23 DIAGNOSIS — Z79899 Other long term (current) drug therapy: Secondary | ICD-10-CM | POA: Diagnosis not present

## 2015-08-23 DIAGNOSIS — N390 Urinary tract infection, site not specified: Secondary | ICD-10-CM | POA: Diagnosis not present

## 2015-08-23 DIAGNOSIS — Z792 Long term (current) use of antibiotics: Secondary | ICD-10-CM | POA: Insufficient documentation

## 2015-08-23 DIAGNOSIS — R300 Dysuria: Secondary | ICD-10-CM | POA: Diagnosis present

## 2015-08-23 LAB — COMPREHENSIVE METABOLIC PANEL
ALK PHOS: 118 U/L (ref 38–126)
ALT: 37 U/L (ref 17–63)
AST: 36 U/L (ref 15–41)
Albumin: 3.6 g/dL (ref 3.5–5.0)
Anion gap: 11 (ref 5–15)
BUN: 6 mg/dL (ref 6–20)
CALCIUM: 9.4 mg/dL (ref 8.9–10.3)
CO2: 26 mmol/L (ref 22–32)
CREATININE: 1.12 mg/dL (ref 0.61–1.24)
Chloride: 100 mmol/L — ABNORMAL LOW (ref 101–111)
GFR calc Af Amer: >60 mL/min (ref >60)
GFR calc non Af Amer: >60 mL/min (ref >60)
GLUCOSE: 111 mg/dL — AB (ref 65–99)
Potassium: 4.3 mmol/L (ref 3.5–5.1)
SODIUM: 137 mmol/L (ref 135–145)
Total Bilirubin: 0.4 mg/dL (ref 0.3–1.2)
Total Protein: 8.3 g/dL — ABNORMAL HIGH (ref 6.5–8.1)

## 2015-08-23 LAB — CBC
HCT: 44.7 % (ref 39.0–52.0)
Hemoglobin: 15.4 g/dL (ref 13.0–17.0)
MCH: 30.6 pg (ref 26.0–34.0)
MCHC: 34.5 g/dL (ref 30.0–36.0)
MCV: 88.9 fL (ref 78.0–100.0)
PLATELETS: 186 10*3/uL (ref 150–400)
RBC: 5.03 MIL/uL (ref 4.22–5.81)
RDW: 13 % (ref 11.5–15.5)
WBC: 8 10*3/uL (ref 4.0–10.5)

## 2015-08-23 LAB — LIPASE, BLOOD: Lipase: 44 U/L (ref 11–51)

## 2015-08-23 NOTE — ED Notes (Signed)
Pt sts abd pain and right flank pain x 6 months

## 2015-08-24 LAB — URINALYSIS, ROUTINE W REFLEX MICROSCOPIC
BILIRUBIN URINE: NEGATIVE
Glucose, UA: NEGATIVE mg/dL
Ketones, ur: NEGATIVE mg/dL
Nitrite: NEGATIVE
Protein, ur: 30 mg/dL — AB
SPECIFIC GRAVITY, URINE: 1.011 (ref 1.005–1.030)
pH: 6 (ref 5.0–8.0)

## 2015-08-24 LAB — URINE MICROSCOPIC-ADD ON

## 2015-08-24 MED ORDER — OXYCODONE-ACETAMINOPHEN 5-325 MG PO TABS
1.0000 | ORAL_TABLET | Freq: Once | ORAL | Status: AC
  Administered 2015-08-24: 1 via ORAL
  Filled 2015-08-24: qty 1

## 2015-08-24 MED ORDER — CIPROFLOXACIN HCL 500 MG PO TABS: 500.0000 mg | ORAL_TABLET | Freq: Two times a day (BID) | ORAL | Status: DC

## 2015-08-24 MED ORDER — HYDROCHLOROTHIAZIDE 25 MG PO TABS: 25.0000 mg | ORAL_TABLET | Freq: Every day | ORAL | Status: DC

## 2015-08-24 MED ORDER — LIDOCAINE HCL (PF) 1 % IJ SOLN
INTRAMUSCULAR | Status: AC
  Administered 2015-08-24: 0.9 mL
  Filled 2015-08-24: qty 5

## 2015-08-24 MED ORDER — OXYCODONE-ACETAMINOPHEN 5-325 MG PO TABS: 1.0000 | ORAL_TABLET | Freq: Four times a day (QID) | ORAL | Status: DC | PRN

## 2015-08-24 MED ORDER — CEFTRIAXONE SODIUM 250 MG IJ SOLR
250.0000 mg | Freq: Once | INTRAMUSCULAR | Status: AC
  Administered 2015-08-24: 250 mg via INTRAMUSCULAR
  Filled 2015-08-24: qty 250

## 2015-08-24 MED ORDER — AZITHROMYCIN 1 G PO PACK
1.0000 g | PACK | Freq: Once | ORAL | Status: AC
  Administered 2015-08-24: 1 g via ORAL
  Filled 2015-08-24: qty 1

## 2015-08-24 MED ORDER — CIPROFLOXACIN HCL 500 MG PO TABS
500.0000 mg | ORAL_TABLET | Freq: Once | ORAL | Status: AC
  Administered 2015-08-24: 500 mg via ORAL
  Filled 2015-08-24: qty 1

## 2015-08-24 NOTE — ED Provider Notes (Signed)
CSN: [id number]     Arrival date & time 08/23/15  1657 History  By signing my name below, I, [name], attest that this documentation has been prepared under the direction and in the presence of [name], MD . Electronically Signed: [name], Scribe. 08/24/2015. 12:23 AM.    Chief Complaint  Patient presents with  . Abdominal Pain   The history is provided by the patient. No language interpreter was used.   HPI Comments:  Keith Hart is a 45 y.o. male with a history of HTN,  who presents to the Emergency Department complaining of hematuria  x ~ 2 weeks. He reports associated dysuria, bilateral flank pain, frequent urination, abdominal pain, nausea and 1 episode of vomiting today. Pt also reports h/o similar episode ~ 5 months ago; he was evaluated and given a meds which resolved his symptom. He tried to refill the med but was denied and advised to follow up with PCP, which he does not have.  He denies fever. No alleviating factors noted. History translated by pt's friend at bedside.   Past Medical History  Diagnosis Date  . Hypertension    History reviewed. No pertinent past surgical history. Family History  Problem Relation Age of Onset  . Hypertension Mother    Social History  Substance Use Topics  . Smoking status: Former Research scientist (life sciences)  . Smokeless tobacco: None  . Alcohol Use: 3.6 oz/week    6 Cans of beer per week    Review of Systems  Constitutional: Negative for fever and chills.  Gastrointestinal: Positive for nausea, vomiting and abdominal pain.  Genitourinary: Positive for dysuria, frequency, hematuria and flank pain.  All other systems reviewed and are negative.   Allergies  Review of patient's allergies indicates no known allergies.  Home Medications   Prior to Admission medications   Medication Sig Start Date End Date Taking? Authorizing Provider  ciprofloxacin (CIPRO) 500 MG tablet Take 1 tablet (500 mg total) by mouth 2 (two) times daily.  01/29/15   Norval Gable, MD  guaiFENesin-dextromethorphan (ROBITUSSIN DM) 100-10 MG/5ML syrup Take 5 mLs by mouth every 4 (four) hours as needed for cough. Patient not taking: Reported on 11/27/2014 08/18/14   Jerrye Noble, MD  hydrochlorothiazide (HYDRODIURIL) 25 MG tablet Take 1 tablet (25 mg total) by mouth daily. 12/02/14   Micheline Chapman, NP  HYDROcodone-acetaminophen (NORCO) 5-325 MG per tablet Take 1 tablet by mouth every 6 (six) hours as needed for moderate pain. 01/29/15   Norval Gable, MD  oxyCODONE-acetaminophen (PERCOCET) 5-325 MG per tablet Take 1-2 tablets by mouth every 6 (six) hours as needed. 11/27/14   Orlie Dakin, MD  traMADol (ULTRAM) 50 MG tablet Take 1 tablet (50 mg total) by mouth every 8 (eight) hours as needed. 12/02/14   Micheline Chapman, NP   BP 190/100 mmHg  Pulse 80  Temp(Src) 98.4 F (36.9 C) (Oral)  Resp 18  SpO2 99% Physical Exam  Constitutional: He is oriented to person, place, and time. He appears well-developed and well-nourished. No distress.  HENT:  Head: Normocephalic and atraumatic.  Eyes: Conjunctivae are normal. Pupils are equal, round, and reactive to light.  Neck: Normal range of motion. Neck supple. No JVD present.  Cardiovascular: Normal rate, regular rhythm and normal heart sounds.   No murmur heard. Pulmonary/Chest: Effort normal and breath sounds normal. He has no wheezes. He has no rales. He exhibits no tenderness.  Abdominal: Soft. Bowel sounds are normal. He exhibits no  distension and no mass. There is no tenderness. There is CVA tenderness (mild bilateral CVA tenderness).  Musculoskeletal: Normal range of motion. He exhibits no edema.  Lymphadenopathy:    He has no cervical adenopathy.  Neurological: He is alert and oriented to person, place, and time. No cranial nerve deficit. He exhibits normal muscle tone. Coordination normal.  Skin: Skin is warm and dry. No rash noted.  Psychiatric: He has a normal mood and affect. His  behavior is normal. Judgment and thought content normal.  Nursing note and vitals reviewed.   ED Course  Procedures   DIAGNOSTIC STUDIES:  Oxygen Saturation is 99% on RA, normal by my interpretation.    COORDINATION OF CARE:  12:20 AM Pt updated with partial results. Discussed treatment plan with pt at bedside and pt agreed to plan.  Labs Review Results for orders placed or performed during the hospital encounter of 08/23/15  Lipase, blood  Result Value Ref Range   Lipase 44 11 - 51 U/L  Comprehensive metabolic panel  Result Value Ref Range   Sodium 137 135 - 145 mmol/L   Potassium 4.3 3.5 - 5.1 mmol/L   Chloride 100 (L) 101 - 111 mmol/L   CO2 26 22 - 32 mmol/L   Glucose, Bld 111 (H) 65 - 99 mg/dL   BUN 6 6 - 20 mg/dL   Creatinine, Ser 1.12 0.61 - 1.24 mg/dL   Calcium 9.4 8.9 - 10.3 mg/dL   Total Protein 8.3 (H) 6.5 - 8.1 g/dL   Albumin 3.6 3.5 - 5.0 g/dL   AST 36 15 - 41 U/L   ALT 37 17 - 63 U/L   Alkaline Phosphatase 118 38 - 126 U/L   Total Bilirubin 0.4 0.3 - 1.2 mg/dL   GFR calc non Af Amer >60 >60 mL/min   GFR calc Af Amer >60 >60 mL/min   Anion gap 11 5 - 15  CBC  Result Value Ref Range   WBC 8.0 4.0 - 10.5 K/uL   RBC 5.03 4.22 - 5.81 MIL/uL   Hemoglobin 15.4 13.0 - 17.0 g/dL   HCT 44.7 39.0 - 52.0 %   MCV 88.9 78.0 - 100.0 fL   MCH 30.6 26.0 - 34.0 pg   MCHC 34.5 30.0 - 36.0 g/dL   RDW 13.0 11.5 - 15.5 %   Platelets 186 150 - 400 K/uL  Urinalysis, Routine w reflex microscopic (not at [facility])  Result Value Ref Range   Color, Urine AMBER (A) YELLOW   APPearance CLOUDY (A) CLEAR   Specific Gravity, Urine 1.011 1.005 - 1.030   pH 6.0 5.0 - 8.0   Glucose, UA NEGATIVE NEGATIVE mg/dL   Hgb urine dipstick MODERATE (A) NEGATIVE   Bilirubin Urine NEGATIVE NEGATIVE   Ketones, ur NEGATIVE NEGATIVE mg/dL   Protein, ur 30 (A) NEGATIVE mg/dL   Nitrite NEGATIVE NEGATIVE   Leukocytes, UA LARGE (A) NEGATIVE  Urine microscopic-add on  Result Value Ref Range    Squamous Epithelial / LPF 0-5 (A) NONE SEEN   WBC, UA TOO NUMEROUS TO COUNT 0 - 5 WBC/hpf   RBC / HPF 6-30 0 - 5 RBC/hpf   Bacteria, UA FEW (A) NONE SEEN    I have personally reviewed and evaluated theselab results as part of my medical decision-making.    MDM   Final diagnoses:  Urinary tract infection without hematuria, site unspecified  Essential hypertension    Hematuria with symptoms suggestive of urinary tract infection. Old records were reviewed and  he had been seen in the ED with similar symptoms last summer and given a prescription for ciprofloxacin. Renal protocol CT scan at that time was negative for urolithiasis or nephrolithiasis. He denies any sexual encounters, but I am worried about possibility of sexual transmitted disease. He was given an injection of ceftriaxone and a dose of azithromycin and discharged with a prescription for ciprofloxacin. Urine culture has been sent.  I personally performed the services described in this documentation, which was scribed in my presence. The recorded information has been reviewed and is accurate.       [name], MD [id number]

## 2015-08-24 NOTE — Discharge Instructions (Signed)
Urinary Tract Infection Urinary tract infections (UTIs) can develop anywhere along your urinary tract. Your urinary tract is your body's drainage system for removing wastes and extra water. Your urinary tract includes two kidneys, two ureters, a bladder, and a urethra. Your kidneys are a pair of bean-shaped organs. Each kidney is about the size of your fist. They are located below your ribs, one on each side of your spine. CAUSES Infections are caused by microbes, which are microscopic organisms, including fungi, viruses, and bacteria. These organisms are so small that they can only be seen through a microscope. Bacteria are the microbes that most commonly cause UTIs. SYMPTOMS  Symptoms of UTIs may vary by age and gender of the patient and by the location of the infection. Symptoms in young women typically include a frequent and intense urge to urinate and a painful, burning feeling in the bladder or urethra during urination. Older women and men are more likely to be tired, shaky, and weak and have muscle aches and abdominal pain. A fever may mean the infection is in your kidneys. Other symptoms of a kidney infection include pain in your back or sides below the ribs, nausea, and vomiting. DIAGNOSIS To diagnose a UTI, your caregiver will ask you about your symptoms. Your caregiver will also ask you to provide a urine sample. The urine sample will be tested for bacteria and white blood cells. White blood cells are made by your body to help fight infection. TREATMENT  Typically, UTIs can be treated with medication. Because most UTIs are caused by a bacterial infection, they usually can be treated with the use of antibiotics. The choice of antibiotic and length of treatment depend on your symptoms and the type of bacteria causing your infection. HOME CARE INSTRUCTIONS  If you were prescribed antibiotics, take them exactly as your caregiver instructs you. Finish the medication even if you feel better after  you have only taken some of the medication.  Drink enough water and fluids to keep your urine clear or pale yellow.  Avoid caffeine, tea, and carbonated beverages. They tend to irritate your bladder.  Empty your bladder often. Avoid holding urine for long periods of time.  Empty your bladder before and after sexual intercourse.  After a bowel movement, women should cleanse from front to back. Use each tissue only once. SEEK MEDICAL CARE IF:   You have back pain.  You develop a fever.  Your symptoms do not begin to resolve within 3 days. SEEK IMMEDIATE MEDICAL CARE IF:   You have severe back pain or lower abdominal pain.  You develop chills.  You have nausea or vomiting.  You have continued burning or discomfort with urination. MAKE SURE YOU:   Understand these instructions.  Will watch your condition.  Will get help right away if you are not doing well or get worse.   This information is not intended to replace advice given to you by your health care provider. Make sure you discuss any questions you have with your health care provider.   Document Released: 03/28/2005 Document Revised: 03/09/2015 Document Reviewed: 07/27/2011 Elsevier Interactive Patient Education 2016 [employer].  Hypertension Hypertension, commonly called high blood pressure, is when the force of blood pumping through your arteries is too strong. Your arteries are the blood vessels that carry blood from your heart throughout your body. A blood pressure reading consists of a higher number over a lower number, such as 110/72. The higher number (systolic) is the pressure inside your  arteries when your heart pumps. The lower number (diastolic) is the pressure inside your arteries when your heart relaxes. Ideally you want your blood pressure below 120/80. Hypertension forces your heart to work harder to pump blood. Your arteries may become narrow or stiff. Having untreated or uncontrolled hypertension can  cause heart attack, stroke, kidney disease, and other problems. RISK FACTORS Some risk factors for high blood pressure are controllable. Others are not.  Risk factors you cannot control include:   Race. You may be at higher risk if you are African American.  Age. Risk increases with age.  Gender. Men are at higher risk than women before age 62 years. After age 72, women are at higher risk than men. Risk factors you can control include:  Not getting enough exercise or physical activity.  Being overweight.  Getting too much fat, sugar, calories, or salt in your diet.  Drinking too much alcohol. SIGNS AND SYMPTOMS Hypertension does not usually cause signs or symptoms. Extremely high blood pressure (hypertensive crisis) may cause headache, anxiety, shortness of breath, and nosebleed. DIAGNOSIS To check if you have hypertension, your health care provider will measure your blood pressure while you are seated, with your arm held at the level of your heart. It should be measured at least twice using the same arm. Certain conditions can cause a difference in blood pressure between your right and left arms. A blood pressure reading that is higher than normal on one occasion does not mean that you need treatment. If it is not clear whether you have high blood pressure, you may be asked to return on a different day to have your blood pressure checked again. Or, you may be asked to monitor your blood pressure at home for 1 or more weeks. TREATMENT Treating high blood pressure includes making lifestyle changes and possibly taking medicine. Living a healthy lifestyle can help lower high blood pressure. You may need to change some of your habits. Lifestyle changes may include:  Following the DASH diet. This diet is high in fruits, vegetables, and whole grains. It is low in salt, red meat, and added sugars.  Keep your sodium intake below 2,300 mg per day.  Getting at least 30-45 minutes of aerobic  exercise at least 4 times per week.  Losing weight if necessary.  Not smoking.  Limiting alcoholic beverages.  Learning ways to reduce stress. Your health care provider may prescribe medicine if lifestyle changes are not enough to get your blood pressure under control, and if one of the following is true:  You are 61-65 years of age and your systolic blood pressure is above 140.  You are 29 years of age or older, and your systolic blood pressure is above 150.  Your diastolic blood pressure is above 90.  You have diabetes, and your systolic blood pressure is over [id number] or your diastolic blood pressure is over 90.  You have kidney disease and your blood pressure is above 140/90.  You have heart disease and your blood pressure is above 140/90. Your personal target blood pressure may vary depending on your medical conditions, your age, and other factors. HOME CARE INSTRUCTIONS  Have your blood pressure rechecked as directed by your health care provider.   Take medicines only as directed by your health care provider. Follow the directions carefully. Blood pressure medicines must be taken as prescribed. The medicine does not work as well when you skip doses. Skipping doses also puts you at risk for problems.  Do  not smoke.   Monitor your blood pressure at home as directed by your health care provider. SEEK MEDICAL CARE IF:   You think you are having a reaction to medicines taken.  You have recurrent headaches or feel dizzy.  You have swelling in your ankles.  You have trouble with your vision. SEEK IMMEDIATE MEDICAL CARE IF:  You develop a severe headache or confusion.  You have unusual weakness, numbness, or feel faint.  You have severe chest or abdominal pain.  You vomit repeatedly.  You have trouble breathing. MAKE SURE YOU:   Understand these instructions.  Will watch your condition.  Will get help right away if you are not doing well or get worse.   This  information is not intended to replace advice given to you by your health care provider. Make sure you discuss any questions you have with your health care provider.   Document Released: 06/18/2005 Document Revised: 11/02/2014 Document Reviewed: 04/10/2013 Elsevier Interactive Patient Education 2016 [employer]  Ciprofloxacin tablets What is this medicine? CIPROFLOXACIN (sip roe FLOX a sin) is a quinolone antibiotic. It is used to treat certain kinds of bacterial infections. It will not work for colds, flu, or other viral infections. This medicine may be used for other purposes; ask your health care provider or pharmacist if you have questions. What should I tell my health care provider before I take this medicine? They need to know if you have any of these conditions: -bone problems -cerebral disease -history of low levels of potassium in the blood -joint problems -irregular heartbeat -kidney disease -myasthenia gravis -seizures -tendon problems -tingling of the fingers or toes, or other nerve disorder -an unusual or allergic reaction to ciprofloxacin, other antibiotics or medicines, foods, dyes, or preservatives -pregnant or trying to get pregnant -breast-feeding How should I use this medicine? Take this medicine by mouth with a glass of water. Follow the directions on the prescription label. Take your medicine at regular intervals. Do not take your medicine more often than directed. Take all of your medicine as directed even if you think your are better. Do not skip doses or stop your medicine early. You can take this medicine with food or on an empty stomach. It can be taken with a meal that contains dairy or calcium, but do not take it alone with a dairy product, like milk or yogurt or calcium-fortified juice. A special MedGuide will be given to you by the pharmacist with each prescription and refill. Be sure to read this information carefully each time. Talk to your pediatrician  regarding the use of this medicine in children. Special care may be needed. Overdosage: If you think you have taken too much of this medicine contact a poison control center or emergency room at once. NOTE: This medicine is only for you. Do not share this medicine with others. What if I miss a dose? If you miss a dose, take it as soon as you can. If it is almost time for your next dose, take only that dose. Do not take double or extra doses. What may interact with this medicine? Do not take this medicine with any of the following medications: -cisapride -droperidol -terfenadine -tizanidine This medicine may also interact with the following medications: -antacids -birth control pills -caffeine -cyclosporin -didanosine (ddI) buffered tablets or powder -medicines for diabetes -medicines for inflammation like ibuprofen, naproxen -methotrexate -multivitamins -omeprazole -phenytoin -probenecid -sucralfate -theophylline -warfarin This list may not describe all possible interactions. Give your health care provider  a list of all the medicines, herbs, non-prescription drugs, or dietary supplements you use. Also tell them if you smoke, drink alcohol, or use illegal drugs. Some items may interact with your medicine. What should I watch for while using this medicine? Tell your doctor or health care professional if your symptoms do not improve. Do not treat diarrhea with over the counter products. Contact your doctor if you have diarrhea that lasts more than 2 days or if it is severe and watery. You may get drowsy or dizzy. Do not drive, use machinery, or do anything that needs mental alertness until you know how this medicine affects you. Do not stand or sit up quickly, especially if you are an older patient. This reduces the risk of dizzy or fainting spells. This medicine can make you more sensitive to the sun. Keep out of the sun. If you cannot avoid being in the sun, wear protective clothing  and use sunscreen. Do not use sun lamps or tanning beds/booths. Avoid antacids, aluminum, calcium, iron, magnesium, and zinc products for 6 hours before and 2 hours after taking a dose of this medicine. What side effects may I notice from receiving this medicine? Side effects that you should report to your doctor or health care professional as soon as possible: -allergic reactions like skin rash or hives, swelling of the face, lips, or tongue -anxious -confusion -depressed mood -diarrhea -fast, irregular heartbeat -hallucination, loss of contact with reality -joint, muscle, or tendon pain or swelling -pain, tingling, numbness in the hands or feet -suicidal thoughts or other mood changes -sunburn -unusually weak or tired Side effects that usually do not require medical attention (report to your doctor or health care professional if they continue or are bothersome): -dry mouth -headache -nausea -trouble sleeping This list may not describe all possible side effects. Call your doctor for medical advice about side effects. You may report side effects to FDA at 1-800-FDA-1088. Where should I keep my medicine? Keep out of the reach of children. Store at room temperature below 30 degrees C (86 degrees F). Keep container tightly closed. Throw away any unused medicine after the expiration date. NOTE: This sheet is a summary. It may not cover all possible information. If you have questions about this medicine, talk to your doctor, pharmacist, or health care provider.    2016, Elsevier/Gold Standard. (2015-01-27 12:57:02)  Acetaminophen; Oxycodone tablets What is this medicine? ACETAMINOPHEN; OXYCODONE (a set a MEE noe fen; ox i KOE done) is a pain reliever. It is used to treat moderate to severe pain. This medicine may be used for other purposes; ask your health care provider or pharmacist if you have questions. What should I tell my health care provider before I take this medicine? They need  to know if you have any of these conditions: -brain tumor -Crohn's disease, inflammatory bowel disease, or ulcerative colitis -drug abuse or addiction -head injury -heart or circulation problems -if you often drink alcohol -kidney disease or problems going to the bathroom -liver disease -lung disease, asthma, or breathing problems -an unusual or allergic reaction to acetaminophen, oxycodone, other opioid analgesics, other medicines, foods, dyes, or preservatives -pregnant or trying to get pregnant -breast-feeding How should I use this medicine? Take this medicine by mouth with a full glass of water. Follow the directions on the prescription label. You can take it with or without food. If it upsets your stomach, take it with food. Take your medicine at regular intervals. Do not take it more often  than directed. Talk to your pediatrician regarding the use of this medicine in children. Special care may be needed. Patients over 104 years old may have a stronger reaction and need a smaller dose. Overdosage: If you think you have taken too much of this medicine contact a poison control center or emergency room at once. NOTE: This medicine is only for you. Do not share this medicine with others. What if I miss a dose? If you miss a dose, take it as soon as you can. If it is almost time for your next dose, take only that dose. Do not take double or extra doses. What may interact with this medicine? -alcohol -antihistamines -barbiturates like amobarbital, butalbital, butabarbital, methohexital, pentobarbital, phenobarbital, thiopental, and secobarbital -benztropine -drugs for bladder problems like solifenacin, trospium, oxybutynin, tolterodine, hyoscyamine, and methscopolamine -drugs for breathing problems like ipratropium and tiotropium -drugs for certain stomach or intestine problems like propantheline, homatropine methylbromide, glycopyrrolate, atropine, belladonna, and dicyclomine -general  anesthetics like etomidate, ketamine, nitrous oxide, propofol, desflurane, enflurane, halothane, isoflurane, and sevoflurane -medicines for depression, anxiety, or psychotic disturbances -medicines for sleep -muscle relaxants -naltrexone -narcotic medicines (opiates) for pain -phenothiazines like perphenazine, thioridazine, chlorpromazine, mesoridazine, fluphenazine, prochlorperazine, promazine, and trifluoperazine -scopolamine -tramadol -trihexyphenidyl This list may not describe all possible interactions. Give your health care provider a list of all the medicines, herbs, non-prescription drugs, or dietary supplements you use. Also tell them if you smoke, drink alcohol, or use illegal drugs. Some items may interact with your medicine. What should I watch for while using this medicine? Tell your doctor or health care professional if your pain does not go away, if it gets worse, or if you have new or a different type of pain. You may develop tolerance to the medicine. Tolerance means that you will need a higher dose of the medication for pain relief. Tolerance is normal and is expected if you take this medicine for a long time. Do not suddenly stop taking your medicine because you may develop a severe reaction. Your body becomes used to the medicine. This does NOT mean you are addicted. Addiction is a behavior related to getting and using a drug for a non-medical reason. If you have pain, you have a medical reason to take pain medicine. Your doctor will tell you how much medicine to take. If your doctor wants you to stop the medicine, the dose will be slowly lowered over time to avoid any side effects. You may get drowsy or dizzy. Do not drive, use machinery, or do anything that needs mental alertness until you know how this medicine affects you. Do not stand or sit up quickly, especially if you are an older patient. This reduces the risk of dizzy or fainting spells. Alcohol may interfere with the  effect of this medicine. Avoid alcoholic drinks. There are different types of narcotic medicines (opiates) for pain. If you take more than one type at the same time, you may have more side effects. Give your health care provider a list of all medicines you use. Your doctor will tell you how much medicine to take. Do not take more medicine than directed. Call emergency for help if you have problems breathing. The medicine will cause constipation. Try to have a bowel movement at least every 2 to 3 days. If you do not have a bowel movement for 3 days, call your doctor or health care professional. Do not take Tylenol (acetaminophen) or medicines that have acetaminophen with this medicine. Too much acetaminophen can  be very dangerous. Many nonprescription medicines contain acetaminophen. Always read the labels carefully to avoid taking more acetaminophen. What side effects may I notice from receiving this medicine? Side effects that you should report to your doctor or health care professional as soon as possible: -allergic reactions like skin rash, itching or hives, swelling of the face, lips, or tongue -breathing difficulties, wheezing -confusion -light headedness or fainting spells -severe stomach pain -unusually weak or tired -yellowing of the skin or the whites of the eyes Side effects that usually do not require medical attention (report to your doctor or health care professional if they continue or are bothersome): -dizziness -drowsiness -nausea -vomiting This list may not describe all possible side effects. Call your doctor for medical advice about side effects. You may report side effects to FDA at 1-800-FDA-1088. Where should I keep my medicine? Keep out of the reach of children. This medicine can be abused. Keep your medicine in a safe place to protect it from theft. Do not share this medicine with anyone. Selling or giving away this medicine is dangerous and against the law. This medicine  may cause accidental overdose and death if it taken by other adults, children, or pets. Mix any unused medicine with a substance like cat litter or coffee grounds. Then throw the medicine away in a sealed container like a sealed bag or a coffee can with a lid. Do not use the medicine after the expiration date. Store at room temperature between 20 and 25 degrees C (68 and 77 degrees F). NOTE: This sheet is a summary. It may not cover all possible information. If you have questions about this medicine, talk to your doctor, pharmacist, or health care provider.    2016, Elsevier/Gold Standard. (2014-05-19 15:18:46)

## 2015-08-26 LAB — URINE CULTURE: Culture: 80000

## 2015-08-27 ENCOUNTER — Telehealth (HOSPITAL_COMMUNITY): Payer: Self-pay

## 2015-08-27 NOTE — Telephone Encounter (Signed)
Post ED Visit - Positive Culture Follow-up  Culture report reviewed by antimicrobial stewardship pharmacist:  []  Elenor Quinones, Pharm.D. []  Heide Guile, Pharm.D., BCPS []  Parks Neptune, Pharm.D. []  Alycia Rossetti, Pharm.D., BCPS []  [place], Pharm.D., BCPS, AAHIVP []  Legrand Como, Pharm.D., BCPS, AAHIVP []  Milus Glazier, Pharm.D. []  Stephens November, Pharm.D. Ulyess Mort Rumbarger, Pharm.D.  Positive urine culture, 80,000 colonies -> E Coli Treated with Ciprofloxacin, organism sensitive to the same and no further patient follow-up is required at this time.  Dortha Kern 08/27/2015, 10:32 PM

## 2015-09-13 ENCOUNTER — Telehealth: Payer: Self-pay | Admitting: Internal Medicine

## 2015-09-13 NOTE — Telephone Encounter (Signed)
APPT. REMINDER CALL, PHON DISCONNECTED

## 2015-09-14 ENCOUNTER — Ambulatory Visit: Payer: BLUE CROSS/BLUE SHIELD | Admitting: Internal Medicine

## 2015-09-14 ENCOUNTER — Encounter: Payer: Self-pay | Admitting: Internal Medicine

## 2015-12-16 ENCOUNTER — Encounter (HOSPITAL_COMMUNITY): Payer: Self-pay | Admitting: Emergency Medicine

## 2015-12-16 DIAGNOSIS — R21 Rash and other nonspecific skin eruption: Secondary | ICD-10-CM | POA: Diagnosis not present

## 2015-12-16 DIAGNOSIS — Z87891 Personal history of nicotine dependence: Secondary | ICD-10-CM | POA: Insufficient documentation

## 2015-12-16 DIAGNOSIS — I10 Essential (primary) hypertension: Secondary | ICD-10-CM | POA: Insufficient documentation

## 2015-12-16 DIAGNOSIS — Z79899 Other long term (current) drug therapy: Secondary | ICD-10-CM | POA: Diagnosis not present

## 2015-12-16 DIAGNOSIS — R140 Abdominal distension (gaseous): Secondary | ICD-10-CM | POA: Diagnosis not present

## 2015-12-16 LAB — CBC
HEMATOCRIT: 41.1 % (ref 39.0–52.0)
HEMOGLOBIN: 13.7 g/dL (ref 13.0–17.0)
MCH: 28.8 pg (ref 26.0–34.0)
MCHC: 33.3 g/dL (ref 30.0–36.0)
MCV: 86.5 fL (ref 78.0–100.0)
Platelets: 145 10*3/uL — ABNORMAL LOW (ref 150–400)
RBC: 4.75 MIL/uL (ref 4.22–5.81)
RDW: 13 % (ref 11.5–15.5)
WBC: 3.8 10*3/uL — ABNORMAL LOW (ref 4.0–10.5)

## 2015-12-16 LAB — URINALYSIS, ROUTINE W REFLEX MICROSCOPIC
GLUCOSE, UA: NEGATIVE mg/dL
HGB URINE DIPSTICK: NEGATIVE
Ketones, ur: NEGATIVE mg/dL
Leukocytes, UA: NEGATIVE
Nitrite: NEGATIVE
PH: 5.5 (ref 5.0–8.0)
Protein, ur: NEGATIVE mg/dL
SPECIFIC GRAVITY, URINE: 1.019 (ref 1.005–1.030)

## 2015-12-16 LAB — COMPREHENSIVE METABOLIC PANEL
ALBUMIN: 3.1 g/dL — AB (ref 3.5–5.0)
ALT: 48 U/L (ref 17–63)
ANION GAP: 9 (ref 5–15)
AST: 75 U/L — ABNORMAL HIGH (ref 15–41)
Alkaline Phosphatase: 172 U/L — ABNORMAL HIGH (ref 38–126)
BUN: 5 mg/dL — ABNORMAL LOW (ref 6–20)
CHLORIDE: 102 mmol/L (ref 101–111)
CO2: 23 mmol/L (ref 22–32)
Calcium: 8.7 mg/dL — ABNORMAL LOW (ref 8.9–10.3)
Creatinine, Ser: 1 mg/dL (ref 0.61–1.24)
GFR calc non Af Amer: >60 mL/min (ref >60)
GLUCOSE: 100 mg/dL — AB (ref 65–99)
Potassium: 3.6 mmol/L (ref 3.5–5.1)
SODIUM: 134 mmol/L — AB (ref 135–145)
Total Bilirubin: 0.8 mg/dL (ref 0.3–1.2)
Total Protein: 7.5 g/dL (ref 6.5–8.1)

## 2015-12-16 LAB — LIPASE, BLOOD: LIPASE: 55 U/L — AB (ref 11–51)

## 2015-12-16 NOTE — ED Notes (Signed)
Called Pts name for triage, pt stated that he needed to call his friend to translate for him.

## 2015-12-16 NOTE — ED Notes (Signed)
Patient arrives with complaint of abdominal distension and constipation. Onset of symptoms 2 weeks ago. Patient normally has flat abdomen but currently has rounded abdomen. Multiple laxatives have been tried with minimal effect.

## 2015-12-16 NOTE — ED Notes (Signed)
Patient speaks [place].

## 2015-12-17 ENCOUNTER — Emergency Department (HOSPITAL_COMMUNITY)
Admission: EM | Admit: 2015-12-17 | Discharge: 2015-12-17 | Disposition: A | Discharge status: Home / Self Care | Payer: BLUE CROSS/BLUE SHIELD | Attending: Emergency Medicine | Admitting: Emergency Medicine

## 2015-12-17 DIAGNOSIS — R140 Abdominal distension (gaseous): Secondary | ICD-10-CM

## 2015-12-17 LAB — RPR: RPR Ser Ql: NONREACTIVE

## 2015-12-17 MED ORDER — ALBENDAZOLE 200 MG PO TABS
400.0000 mg | ORAL_TABLET | Freq: Once | ORAL | Status: DC
  Filled 2015-12-17: qty 2

## 2015-12-17 MED ORDER — BENZONATATE 100 MG PO CAPS: 100.0000 mg | ORAL_CAPSULE | Freq: Three times a day (TID) | ORAL | Status: DC

## 2015-12-17 NOTE — Discharge Instructions (Signed)
Try miralax 8 scoops in 32 oz of a liquid of your choice.  Stay near a bathroom.

## 2015-12-17 NOTE — ED Notes (Signed)
Pt and ride anxious to leave; MD notified

## 2015-12-17 NOTE — ED Provider Notes (Signed)
CSN: [id number]     Arrival date & time 12/16/15  2131 History  By signing my name below, I, Keith Hart, attest that this documentation has been prepared under the direction and in the presence of Keith Etienne, DO. Electronically Signed: Altamease Hart, ED Scribe. 12/17/2015. 2:31 AM   Chief Complaint  Patient presents with  . Constipation  . Bloated    The history is provided by the patient and a relative. A language interpreter was used.   Keith Hart is a 45 y.o. male who presents to the Emergency Department complaining of worsening abdominal bloating with onset at least 20 days ago. Associated symptoms include decreased appetite and constipation, last bowel movement was 3 days ago.OTC laxatives have provided no relief at home.  He also complains of a headache and mild cough. Pt denies abdominal pain, vomiting, hematochezia. The pt has been in the [place] for 1 year and 6 months and has not travelled internationally since. The pt states that he eats raw beef but has not visualized any parasites in his food since coming to this country.  He does not use alcohol.  The pt speaks [place]. A relative is at bedside translating.   Past Medical History  Diagnosis Date  . Hypertension    History reviewed. No pertinent past surgical history. Family History  Problem Relation Age of Onset  . Hypertension Mother    Social History  Substance Use Topics  . Smoking status: Former Research scientist (life sciences)  . Smokeless tobacco: None  . Alcohol Use: 3.6 oz/week    6 Cans of beer per week    Review of Systems  Constitutional: Positive for appetite change. Negative for fever and chills.  HENT: Negative for congestion and facial swelling.   Eyes: Negative for discharge and visual disturbance.  Respiratory: Positive for cough. Negative for shortness of breath.   Cardiovascular: Negative for chest pain and palpitations.  Gastrointestinal: Positive for constipation and abdominal distention.  Negative for nausea, vomiting, abdominal pain, diarrhea and blood in stool.  Musculoskeletal: Negative for myalgias and arthralgias.  Skin: Negative for color change and rash.  Neurological: Positive for headaches. Negative for tremors and syncope.  Psychiatric/Behavioral: Negative for confusion and dysphoric mood.   Allergies  Review of patient's allergies indicates no known allergies.  Home Medications   Prior to Admission medications   Medication Sig Start Date End Date Taking? Authorizing Provider  benzonatate (TESSALON) 100 MG capsule Take 1 capsule (100 mg total) by mouth every 8 (eight) hours. 12/17/15   Keith Etienne, DO  ciprofloxacin (CIPRO) 500 MG tablet Take 1 tablet (500 mg total) by mouth 2 (two) times daily. Patient not taking: Reported on 12/17/2015 0000000   [name], MD  hydrochlorothiazide (HYDRODIURIL) 25 MG tablet Take 1 tablet (25 mg total) by mouth daily. Patient not taking: Reported on 12/17/2015 0000000   [name], MD  oxyCODONE-acetaminophen (PERCOCET) 5-325 MG tablet Take 1 tablet by mouth every 6 (six) hours as needed. Patient not taking: Reported on 12/17/2015 0000000   [name], MD   BP Q000111Q mmHg  Pulse 95  Temp(Src) 99 F (37.2 C) (Oral)  Resp 18  Wt 162 lb 3 oz (73.568 kg)  SpO2 100% Physical Exam  Constitutional: He is oriented to person, place, and time. He appears well-developed and well-nourished.  HENT:  Head: Normocephalic and atraumatic.  Eyes: EOM are normal. Pupils are equal, round, and reactive to light.  Neck: Normal range of motion. Neck supple. No JVD present.  Cardiovascular: Normal rate and regular rhythm.  Exam reveals no gallop and no friction rub.   No murmur heard. Pulmonary/Chest: No respiratory distress. He has no wheezes.  Abdominal: He exhibits distension. There is no rebound and no guarding.  Tympanitic to percussion  Mildly distended No noted tenderness  Musculoskeletal: Normal range of motion.  Neurological: He is  alert and oriented to person, place, and time.  Skin: Rash noted. No pallor.  Hyperpigmented rash to palms and soles   Psychiatric: He has a normal mood and affect. His behavior is normal.  Nursing note and vitals reviewed.   ED Course  Procedures (including critical care time) DIAGNOSTIC STUDIES: Oxygen Saturation is 100% on RA,  normal by my interpretation.    COORDINATION OF CARE: 1:44 AM Discussed treatment plan which includes lab work and Albenza with pt at bedside and pt agreed to plan.  Labs Review Labs Reviewed  LIPASE, BLOOD - Abnormal; Notable for the following:    Lipase 55 (*)    All other components within normal limits  COMPREHENSIVE METABOLIC PANEL - Abnormal; Notable for the following:    Sodium 134 (*)    Glucose, Bld 100 (*)    BUN 5 (*)    Calcium 8.7 (*)    Albumin 3.1 (*)    AST 75 (*)    Alkaline Phosphatase 172 (*)    All other components within normal limits  CBC - Abnormal; Notable for the following:    WBC 3.8 (*)    Platelets 145 (*)    All other components within normal limits  URINALYSIS, ROUTINE W REFLEX MICROSCOPIC (NOT AT [facility]) - Abnormal; Notable for the following:    Color, Urine AMBER (*)    Bilirubin Urine SMALL (*)    All other components within normal limits  RPR    Imaging Review No results found. I have personally reviewed and evaluated these lab results as part of my medical decision-making.   EKG Interpretation None      MDM   Final diagnoses:  Bloating    45 yo M With a chief complaint of bloating. This been going on for about 3 months. Patient emigrated over a year ago. Denies recent foreign travel. Has been eating foods typical to his culture. Denies likelihood of parasite though has had them in the past. Denies vomiting has had some nausea last bowel movement was 3 days ago. Still having flatus. On my exam abdomen is soft slightly distended and tympanitic. Laboratory evaluation unremarkable. We'll have the patient do  a trial MiraLAX. Will treat here presumptively for possible parasitic disease. PCP follow-up.  I personally performed the services described in this documentation, which was scribed in my presence. The recorded information has been reviewed and is accurate.     Keith Etienne, DO 12/17/15 [id number]

## 2015-12-21 ENCOUNTER — Other Ambulatory Visit: Payer: Self-pay | Admitting: Gastroenterology

## 2015-12-21 ENCOUNTER — Ambulatory Visit
Admission: RE | Admit: 2015-12-21 | Discharge: 2015-12-21 | Disposition: A | Discharge status: Home / Self Care | Payer: BLUE CROSS/BLUE SHIELD | Source: Ambulatory Visit | Attending: Gastroenterology | Admitting: Gastroenterology

## 2015-12-21 DIAGNOSIS — R740 Nonspecific elevation of levels of transaminase and lactic acid dehydrogenase [LDH]: Principal | ICD-10-CM

## 2015-12-21 DIAGNOSIS — R140 Abdominal distension (gaseous): Secondary | ICD-10-CM

## 2015-12-21 DIAGNOSIS — R7402 Elevation of levels of lactic acid dehydrogenase (LDH): Secondary | ICD-10-CM

## 2015-12-21 DIAGNOSIS — R7401 Elevation of levels of liver transaminase levels: Secondary | ICD-10-CM

## 2015-12-26 ENCOUNTER — Ambulatory Visit (HOSPITAL_COMMUNITY)
Admission: RE | Admit: 2015-12-26 | Discharge: 2015-12-26 | Disposition: A | Discharge status: Home / Self Care | Payer: BLUE CROSS/BLUE SHIELD | Source: Ambulatory Visit | Attending: Gastroenterology | Admitting: Gastroenterology

## 2015-12-26 DIAGNOSIS — R932 Abnormal findings on diagnostic imaging of liver and biliary tract: Secondary | ICD-10-CM | POA: Insufficient documentation

## 2015-12-26 DIAGNOSIS — R740 Nonspecific elevation of levels of transaminase and lactic acid dehydrogenase [LDH]: Secondary | ICD-10-CM | POA: Diagnosis not present

## 2015-12-26 DIAGNOSIS — R140 Abdominal distension (gaseous): Secondary | ICD-10-CM | POA: Diagnosis not present

## 2015-12-26 DIAGNOSIS — R188 Other ascites: Secondary | ICD-10-CM | POA: Insufficient documentation

## 2015-12-26 DIAGNOSIS — R7401 Elevation of levels of liver transaminase levels: Secondary | ICD-10-CM

## 2015-12-26 DIAGNOSIS — K8689 Other specified diseases of pancreas: Secondary | ICD-10-CM | POA: Diagnosis not present

## 2015-12-27 ENCOUNTER — Encounter (HOSPITAL_COMMUNITY): Payer: Self-pay

## 2015-12-27 ENCOUNTER — Emergency Department (HOSPITAL_COMMUNITY): Payer: BLUE CROSS/BLUE SHIELD

## 2015-12-27 ENCOUNTER — Observation Stay (HOSPITAL_COMMUNITY)
Admission: EM | Admit: 2015-12-27 | Discharge: 2015-12-29 | Disposition: A | Discharge status: Home / Self Care | Payer: BLUE CROSS/BLUE SHIELD | Attending: Internal Medicine | Admitting: Internal Medicine

## 2015-12-27 DIAGNOSIS — R05 Cough: Secondary | ICD-10-CM | POA: Diagnosis not present

## 2015-12-27 DIAGNOSIS — R188 Other ascites: Secondary | ICD-10-CM | POA: Insufficient documentation

## 2015-12-27 DIAGNOSIS — Z7189 Other specified counseling: Secondary | ICD-10-CM | POA: Insufficient documentation

## 2015-12-27 DIAGNOSIS — E876 Hypokalemia: Secondary | ICD-10-CM | POA: Insufficient documentation

## 2015-12-27 DIAGNOSIS — K8689 Other specified diseases of pancreas: Principal | ICD-10-CM | POA: Insufficient documentation

## 2015-12-27 DIAGNOSIS — I10 Essential (primary) hypertension: Secondary | ICD-10-CM | POA: Diagnosis not present

## 2015-12-27 DIAGNOSIS — G43909 Migraine, unspecified, not intractable, without status migrainosus: Secondary | ICD-10-CM | POA: Insufficient documentation

## 2015-12-27 DIAGNOSIS — Z87891 Personal history of nicotine dependence: Secondary | ICD-10-CM | POA: Insufficient documentation

## 2015-12-27 DIAGNOSIS — Z515 Encounter for palliative care: Secondary | ICD-10-CM | POA: Insufficient documentation

## 2015-12-27 DIAGNOSIS — K219 Gastro-esophageal reflux disease without esophagitis: Secondary | ICD-10-CM | POA: Diagnosis not present

## 2015-12-27 DIAGNOSIS — R591 Generalized enlarged lymph nodes: Secondary | ICD-10-CM | POA: Insufficient documentation

## 2015-12-27 DIAGNOSIS — R945 Abnormal results of liver function studies: Secondary | ICD-10-CM | POA: Insufficient documentation

## 2015-12-27 DIAGNOSIS — Z66 Do not resuscitate: Secondary | ICD-10-CM | POA: Diagnosis not present

## 2015-12-27 DIAGNOSIS — K869 Disease of pancreas, unspecified: Secondary | ICD-10-CM | POA: Diagnosis not present

## 2015-12-27 DIAGNOSIS — R1011 Right upper quadrant pain: Secondary | ICD-10-CM | POA: Insufficient documentation

## 2015-12-27 HISTORY — DX: Headache, unspecified: R51.9

## 2015-12-27 HISTORY — DX: Disorder of kidney and ureter, unspecified: N28.9

## 2015-12-27 HISTORY — DX: Headache: R51

## 2015-12-27 HISTORY — DX: Migraine, unspecified, not intractable, without status migrainosus: G43.909

## 2015-12-27 HISTORY — DX: Gastro-esophageal reflux disease without esophagitis: K21.9

## 2015-12-27 HISTORY — DX: Other specified diseases of pancreas: K86.89

## 2015-12-27 HISTORY — DX: Gastritis, unspecified, without bleeding: K29.70

## 2015-12-27 LAB — URINALYSIS, ROUTINE W REFLEX MICROSCOPIC
Bilirubin Urine: NEGATIVE
Glucose, UA: NEGATIVE mg/dL
Hgb urine dipstick: NEGATIVE
Ketones, ur: NEGATIVE mg/dL
LEUKOCYTES UA: NEGATIVE
NITRITE: NEGATIVE
PROTEIN: NEGATIVE mg/dL
Specific Gravity, Urine: 1.024 (ref 1.005–1.030)
pH: 6 (ref 5.0–8.0)

## 2015-12-27 LAB — COMPREHENSIVE METABOLIC PANEL
ALK PHOS: 165 U/L — AB (ref 38–126)
ALT: 64 U/L — AB (ref 17–63)
AST: 114 U/L — ABNORMAL HIGH (ref 15–41)
Albumin: 3.2 g/dL — ABNORMAL LOW (ref 3.5–5.0)
Anion gap: 9 (ref 5–15)
BUN: <5 mg/dL — ABNORMAL LOW (ref 6–20)
CALCIUM: 8.9 mg/dL (ref 8.9–10.3)
CHLORIDE: 103 mmol/L (ref 101–111)
CO2: 21 mmol/L — ABNORMAL LOW (ref 22–32)
CREATININE: 0.84 mg/dL (ref 0.61–1.24)
Glucose, Bld: 140 mg/dL — ABNORMAL HIGH (ref 65–99)
Potassium: 2.8 mmol/L — ABNORMAL LOW (ref 3.5–5.1)
Sodium: 133 mmol/L — ABNORMAL LOW (ref 135–145)
TOTAL PROTEIN: 7.8 g/dL (ref 6.5–8.1)
Total Bilirubin: 1 mg/dL (ref 0.3–1.2)

## 2015-12-27 LAB — CBC
HCT: 42.8 % (ref 39.0–52.0)
Hemoglobin: 15 g/dL (ref 13.0–17.0)
MCH: 30 pg (ref 26.0–34.0)
MCHC: 35 g/dL (ref 30.0–36.0)
MCV: 85.6 fL (ref 78.0–100.0)
PLATELETS: 206 10*3/uL (ref 150–400)
RBC: 5 MIL/uL (ref 4.22–5.81)
RDW: 12.9 % (ref 11.5–15.5)
WBC: 4.8 10*3/uL (ref 4.0–10.5)

## 2015-12-27 LAB — MAGNESIUM: MAGNESIUM: 2.1 mg/dL (ref 1.7–2.4)

## 2015-12-27 LAB — LIPASE, BLOOD: LIPASE: 60 U/L — AB (ref 11–51)

## 2015-12-27 MED ORDER — POTASSIUM CHLORIDE 10 MEQ/100ML IV SOLN
10.0000 meq | INTRAVENOUS | Status: AC
Start: 2015-12-27 — End: 2015-12-28
  Administered 2015-12-27 (×2): 10 meq via INTRAVENOUS
  Filled 2015-12-27 (×2): qty 100

## 2015-12-27 MED ORDER — PANTOPRAZOLE SODIUM 40 MG PO TBEC
40.0000 mg | DELAYED_RELEASE_TABLET | Freq: Every day | ORAL | Status: DC
  Administered 2015-12-28 – 2015-12-29 (×2): 40 mg via ORAL
  Filled 2015-12-27: qty 1
  Filled 2015-12-27: qty 2

## 2015-12-27 MED ORDER — POTASSIUM CHLORIDE 10 MEQ/100ML IV SOLN
10.0000 meq | INTRAVENOUS | Status: AC
  Administered 2015-12-27 (×2): 10 meq via INTRAVENOUS
  Filled 2015-12-27 (×2): qty 100

## 2015-12-27 MED ORDER — IOPAMIDOL (ISOVUE-300) INJECTION 61%
INTRAVENOUS | Status: AC
  Administered 2015-12-27: 100 mL
  Filled 2015-12-27: qty 100

## 2015-12-27 NOTE — ED Notes (Signed)
Patient transported to CT 

## 2015-12-27 NOTE — ED Provider Notes (Signed)
CSN: [id number]     Arrival date & time 12/27/15  1600 History   First MD Initiated Contact with Patient 12/27/15 1829     Chief Complaint  Patient presents with  . Abdominal Pain     (Consider location/radiation/quality/duration/timing/severity/associated sxs/prior Treatment) HPI  Speaks [place], friend is translating Comes in with concern for abdominal distention, over one month Coughing nonstop at night, over one month, no night sweats, no hemoptysis No fevers Not eating, because of abdominal distention  Gastritis Refugee for 38yrs, then moved here 1 yr ago No PCP now  Had US done showing possible lymph node vs mass Dr. Amedeo Plenty, Gastroenterologist sent here for evaluation   Past Medical History  Diagnosis Date  . Hypertension   . Gastritis   . Renal disorder     Pt reports Kidney problems intermittenlty. Unknown specifics   History reviewed. No pertinent past surgical history. Family History  Problem Relation Age of Onset  . Hypertension Mother    Social History  Substance Use Topics  . Smoking status: Former Research scientist (life sciences)  . Smokeless tobacco: None  . Alcohol Use: 3.6 oz/week    6 Cans of beer per week    Review of Systems  Constitutional: Positive for appetite change and fatigue. Negative for fever.  HENT: Negative for sore throat.   Eyes: Negative for visual disturbance.  Respiratory: Positive for cough. Negative for shortness of breath.   Cardiovascular: Negative for chest pain.  Gastrointestinal: Positive for abdominal pain and abdominal distention. Negative for nausea and vomiting.  Genitourinary: Negative for difficulty urinating.  Musculoskeletal: Negative for back pain and neck stiffness.  Skin: Negative for rash.  Neurological: Negative for syncope and headaches.      Allergies  Review of patient's allergies indicates no known allergies.  Home Medications   Prior to Admission medications   Medication Sig Start Date End Date Taking? Authorizing  Provider  amoxicillin (AMOXIL) 500 MG capsule Take 500 mg by mouth 4 (four) times daily.   Yes Historical Provider, MD  clarithromycin (BIAXIN) 500 MG tablet Take 500 mg by mouth 2 (two) times daily.   Yes Historical Provider, MD  lansoprazole (PREVACID) 30 MG capsule Take 30 mg by mouth 2 (two) times daily before a meal.   Yes Historical Provider, MD   BP 169/96 mmHg  Pulse 87  Temp(Src) 98.6 F (37 C) (Oral)  Resp 20  Ht 5\' 5"  (1.651 m)  Wt 154 lb 12.8 oz (70.217 kg)  BMI 25.76 kg/m2  SpO2 99% Physical Exam  Constitutional: He is oriented to person, place, and time. He appears well-developed and well-nourished. No distress.  HENT:  Head: Normocephalic and atraumatic.  Eyes: Conjunctivae and EOM are normal.  Neck: Normal range of motion.  Cardiovascular: Normal rate, regular rhythm, normal heart sounds and intact distal pulses.  Exam reveals no gallop and no friction rub.   No murmur heard. Pulmonary/Chest: Effort normal and breath sounds normal. No respiratory distress. He has no wheezes. He has no rales.  Abdominal: Soft. He exhibits distension, fluid wave and ascites. There is no tenderness. There is no guarding.  Musculoskeletal: He exhibits no edema.  Neurological: He is alert and oriented to person, place, and time.  Skin: Skin is warm and dry. He is not diaphoretic.  Nursing note and vitals reviewed.   ED Course  Procedures (including critical care time) Labs Review Labs Reviewed  LIPASE, BLOOD - Abnormal; Notable for the following:    Lipase 60 (*)    All  other components within normal limits  COMPREHENSIVE METABOLIC PANEL - Abnormal; Notable for the following:    Sodium 133 (*)    Potassium 2.8 (*)    CO2 21 (*)    Glucose, Bld 140 (*)    BUN <5 (*)    Albumin 3.2 (*)    AST 114 (*)    ALT 64 (*)    Alkaline Phosphatase 165 (*)    All other components within normal limits  BODY FLUID CELL COUNT WITH DIFFERENTIAL - Abnormal; Notable for the following:     Color, Fluid YELLOW (*)    Appearance, Fluid HAZY (*)    WBC, Fluid 2082 (*)    Monocyte-Macrophage-Serous Fluid 16 (*)    All other components within normal limits  CBC  URINALYSIS, ROUTINE W REFLEX MICROSCOPIC (NOT AT [facility])  MAGNESIUM  PROTEIN, BODY FLUID  CYTOLOGY - NON PAP    Imaging Review Dg Chest 2 View  12/27/2015  CLINICAL DATA:  Cough for 1 month EXAM: CHEST  2 VIEW COMPARISON:  12/21/2015 FINDINGS: The heart size and mediastinal contours are within normal limits. Both lungs are clear. The visualized skeletal structures are unremarkable. IMPRESSION: No active cardiopulmonary disease. Electronically Signed   By: Andreas Newport M.D.   On: 12/27/2015 21:30   Ct Abdomen Pelvis W Contrast  12/27/2015  CLINICAL DATA:  Abdominal pain with abnormal ultrasound EXAM: CT ABDOMEN AND PELVIS WITH CONTRAST TECHNIQUE: Multidetector CT imaging of the abdomen and pelvis was performed using the standard protocol following bolus administration of intravenous contrast. CONTRAST:  155mL ISOVUE-300 IOPAMIDOL (ISOVUE-300) INJECTION 61% COMPARISON:  12/26/2015, CT from 01/29/2015 FINDINGS: Lung bases are free of acute infiltrate or sizable effusion. A small 4 mm nodule is again noted in the left lower lobe and stable from the prior CT examination. The liver is homogeneous in attenuation. The gallbladder is partially decompressed. The adrenal glands and kidneys are within normal limits. The pancreas is well visualized and there is a hypodense lesion adjacent to the head of the pancreas best seen on image number 30 of series 2. This demonstrates central decreased attenuation which may represent some necrosis. It measures approximately 2.4 cm. This lies immediately adjacent to the head of the pancreas and may be exophytic from the head of the pancreas. This was not present on the prior exam. In the tail of the pancreas there is a multi focal mass lesion which measures approximately 7 by 6.5 cm with similar  appearance to that noted in the region of the head of the pancreas. There is also some local invasion into the spleen. These changes have also increased significantly in the interval from the prior exam. The spleen is otherwise within normal limits. Multiple venous collaterals are noted in the left upper abdomen may which may be related to underlying splenic vein narrowing or occlusion by the mass lesion in the pancreas. Significant ascites is noted. Scattered lymph nodes are noted in the periaortic region particularly on image number 37 of series 2 which measures approximately 15 mm in short axis. Portacaval lymph nodes are also seen as well as lymphadenopathy in the region of the gastrohepatic ligament and celiac axis. Bony structures are within normal limits. IMPRESSION: Changes consistent with a mass in the tail of the pancreas with evidence of in growth into the hilum of the spleen with ascites and significant lymphadenopathy as described. This is consistent with a pancreatic malignancy. There are also noted changes suggestive of splenic vein narrowing or occlusion. Electronically  Signed   By: Inez Catalina M.D.   On: 12/27/2015 21:24   US Paracentesis  12/28/2015  INDICATION: Pancreatic mass. Ascites. Request for diagnostic and therapeutic paracentesis. EXAM: ULTRASOUND GUIDED RIGHT LATERAL ABDOMEN PARACENTESIS MEDICATIONS: 1% Lidocaine. COMPLICATIONS: None immediate. PROCEDURE: Informed written consent was obtained from the patient after a discussion of the risks, benefits and alternatives to treatment. A timeout was performed prior to the initiation of the procedure. Initial ultrasound scanning demonstrates a moderate amount of ascites within the right lower abdominal quadrant. The right lower abdomen was prepped and draped in the usual sterile fashion. 1% lidocaine with epinephrine was used for local anesthesia. Following this, a 19 gauge, 7-cm, Yueh catheter was introduced. An ultrasound image was saved  for documentation purposes. The paracentesis was performed. The catheter was removed and a dressing was applied. The patient tolerated the procedure well without immediate post procedural complication. FINDINGS: A total of approximately 11.2 liters of clear yellow fluid was removed. Samples were sent to the laboratory as requested by the clinical team. IMPRESSION: Successful ultrasound-guided paracentesis yielding 1.2 liters of peritoneal fluid. Read by:  Gareth Eagle, PA-C Electronically Signed   By: Jacqulynn Cadet M.D.   On: 12/28/2015 08:57   I have personally reviewed and evaluated these images and lab results as part of my medical decision-making.   EKG Interpretation None      MDM   Final diagnoses:  Pancreatic mass   45yo male refugee from [place] who moved here one year ago, htn, gastritis, presents with concern for 1 mo abdominal distention, weight loss, low appetite and sent from GI office for concern of US showing mass versus lymphadenopathy.  CT abdomen pelvis performed showing concern for pancreatiic cancer. Patient admitted to hospitalist for further care.  Labs show hypokalemia. Pt admitted in stable condition.    Gareth Morgan, MD 12/28/15 1422

## 2015-12-27 NOTE — ED Notes (Signed)
Patient transported to X-ray 

## 2015-12-27 NOTE — ED Notes (Signed)
hospitalist at he bedside.

## 2015-12-27 NOTE — H&P (Signed)
History and Physical    Keith Hart [id number] DOB: 03-29-1971 DOA: 12/27/2015   PCP: No primary care provider on file. Chief Complaint:  Chief Complaint  Patient presents with  . Abdominal Pain    HPI: Keith Hart is a 45 y.o. male with medical history significant of being in refugee camp in [place] for 20 years before finally moving here 1 year ago.  His friend who is with him is translating.  Patient comes in from Dr. Amedeo Plenty (GI) office with concern for abdominal distention, ascites, non stop coughing at night.  Symptoms onset over 1 month ago, have been worsening, are associated with poor PO intake.  Saw Dr. Amedeo Plenty at office for symptoms, had abdominal ultrasound which was worrisome for metastatic cancer findings and so got sent to ED for CT scan.  ED Course: CT sadly shows what appears to be pancreatic cancer with lymph node mets, pancreatic head lesion, pancreatic tail lesion that is invading spleen.  Review of Systems: As per HPI otherwise 10 point review of systems negative.    Past Medical History  Diagnosis Date  . Hypertension   . Gastritis   . Renal disorder     Pt reports Kidney problems intermittenlty. Unknown specifics    History reviewed. No pertinent past surgical history.   reports that he has quit smoking. He does not have any smokeless tobacco history on file. He reports that he drinks about 3.6 oz of alcohol per week. He reports that he does not use illicit drugs.  No Known Allergies  Family History  Problem Relation Age of Onset  . Hypertension Mother       Prior to Admission medications   Medication Sig Start Date End Date Taking? Authorizing Provider  amoxicillin (AMOXIL) 500 MG capsule Take 500 mg by mouth 4 (four) times daily.   Yes Historical Provider, MD  clarithromycin (BIAXIN) 500 MG tablet Take 500 mg by mouth 2 (two) times daily.   Yes Historical Provider, MD  lansoprazole (PREVACID) 30 MG capsule Take 30 mg by mouth 2 (two)  times daily before a meal.   Yes Historical Provider, MD  benzonatate (TESSALON) 100 MG capsule Take 1 capsule (100 mg total) by mouth every 8 (eight) hours. Patient not taking: Reported on 12/27/2015 12/17/15   [name], DO  ciprofloxacin (CIPRO) 500 MG tablet Take 1 tablet (500 mg total) by mouth 2 (two) times daily. Patient not taking: Reported on 12/17/2015 0000000   [name], MD  hydrochlorothiazide (HYDRODIURIL) 25 MG tablet Take 1 tablet (25 mg total) by mouth daily. Patient not taking: Reported on 12/17/2015 0000000   [name], MD  oxyCODONE-acetaminophen (PERCOCET) 5-325 MG tablet Take 1 tablet by mouth every 6 (six) hours as needed. Patient not taking: Reported on 12/17/2015 0000000   [name], MD    Physical Exam: Filed Vitals:   12/27/15 2145 12/27/15 2200 12/27/15 2215 12/27/15 2230  BP: 144/94 144/97 151/99 133/76  Pulse: 84 82 90 84  Temp:      Resp: 26 27 22 28   Weight:      SpO2: 100% 99% 99% 99%      Constitutional: NAD, calm, comfortable Eyes: PERRL, lids and conjunctivae normal ENMT: Mucous membranes are moist. Posterior pharynx clear of any exudate or lesions.Normal dentition.  Neck: normal, supple, no masses, no thyromegaly Respiratory: clear to auscultation bilaterally, no wheezing, no crackles. Normal respiratory effort. No accessory muscle use.  Cardiovascular: Regular rate and rhythm, no murmurs / rubs /  gallops. No extremity edema. 2+ pedal pulses. No carotid bruits.  Abdomen: no tenderness, no masses palpated. No hepatosplenomegaly. Bowel sounds positive.  Musculoskeletal: no clubbing / cyanosis. No joint deformity upper and lower extremities. Good ROM, no contractures. Normal muscle tone.  Skin: no rashes, lesions, ulcers. No induration Neurologic: CN 2-12 grossly intact. Sensation intact, DTR normal. Strength 5/5 in all 4.  Psychiatric: Normal judgment and insight. Alert and oriented x 3. Normal mood.    Labs on Admission: I have personally  reviewed following labs and imaging studies  CBC:  Recent Labs Lab 12/27/15 1615  WBC 4.8  HGB 15.0  HCT 42.8  MCV 85.6  PLT 99991111   Basic Metabolic Panel:  Recent Labs Lab 12/27/15 1615  NA 133*  K 2.8*  CL 103  CO2 21*  GLUCOSE 140*  BUN <5*  CREATININE 0.84  CALCIUM 8.9  MG 2.1   GFR: CrCl cannot be calculated (Unknown ideal weight.). Liver Function Tests:  Recent Labs Lab 12/27/15 1615  AST 114*  ALT 64*  ALKPHOS 165*  BILITOT 1.0  PROT 7.8  ALBUMIN 3.2*    Recent Labs Lab 12/27/15 1615  LIPASE 60*   No results for input(s): AMMONIA in the last 168 hours. Coagulation Profile: No results for input(s): INR, PROTIME in the last 168 hours. Cardiac Enzymes: No results for input(s): CKTOTAL, CKMB, CKMBINDEX, TROPONINI in the last 168 hours. BNP (last 3 results) No results for input(s): PROBNP in the last 8760 hours. HbA1C: No results for input(s): HGBA1C in the last 72 hours. CBG: No results for input(s): GLUCAP in the last 168 hours. Lipid Profile: No results for input(s): CHOL, HDL, LDLCALC, TRIG, CHOLHDL, LDLDIRECT in the last 72 hours. Thyroid Function Tests: No results for input(s): TSH, T4TOTAL, FREET4, T3FREE, THYROIDAB in the last 72 hours. Anemia Panel: No results for input(s): VITAMINB12, FOLATE, FERRITIN, TIBC, IRON, RETICCTPCT in the last 72 hours. Urine analysis:    Component Value Date/Time   COLORURINE YELLOW 12/27/2015 2113   APPEARANCEUR CLEAR 12/27/2015 2113   LABSPEC 1.024 12/27/2015 2113   PHURINE 6.0 12/27/2015 2113   GLUCOSEU NEGATIVE 12/27/2015 2113   HGBUR NEGATIVE 12/27/2015 2113   BILIRUBINUR NEGATIVE 12/27/2015 2113   BILIRUBINUR neg 08/04/2014 0956   KETONESUR NEGATIVE 12/27/2015 2113   PROTEINUR NEGATIVE 12/27/2015 2113   PROTEINUR neg 08/04/2014 0956   UROBILINOGEN 1.0 01/29/2015 1240   UROBILINOGEN 0.2 08/04/2014 0956   NITRITE NEGATIVE 12/27/2015 2113   NITRITE neg 08/04/2014 0956   LEUKOCYTESUR NEGATIVE  12/27/2015 2113   Sepsis Labs: @LABRCNTIP (procalcitonin:4,lacticidven:4) )No results found for this or any previous visit (from the past 240 hour(s)).   Radiological Exams on Admission: Dg Chest 2 View  12/27/2015  CLINICAL DATA:  Cough for 1 month EXAM: CHEST  2 VIEW COMPARISON:  12/21/2015 FINDINGS: The heart size and mediastinal contours are within normal limits. Both lungs are clear. The visualized skeletal structures are unremarkable. IMPRESSION: No active cardiopulmonary disease. Electronically Signed   By: Andreas Newport M.D.   On: 12/27/2015 21:30   US Abdomen Complete  12/26/2015  CLINICAL DATA:  Six weeks of abdominal distension, elevated liver function studies EXAM: ABDOMEN ULTRASOUND COMPLETE COMPARISON:  Abdominal and pelvic CT scan of January 29, 2015 FINDINGS: Gallbladder: No gallstones or wall thickening visualized. No sonographic Murphy sign noted by sonographer. Common bile duct: Diameter: 3.6 mm Liver: The hepatic echotexture is mildly increased. There is no focal mass nor ductal dilation. There is a moderate amount of ascites however.  The surface contour of the liver is slightly irregular. IVC: No abnormality visualized. Pancreas: The pancreatic body is grossly normal. There are masslike within or adjacent to the pancreatic head with the largest measuring 2.3 cm in greatest dimension. The pancreatic tail is not well visualized. Spleen: Multiple masses lie within the splenic hilum and appear to invade the spleen. The spleen itself measures 9.5 cm in length. Right Kidney: Length: 10.5 cm. Echogenicity within normal limits. No mass or hydronephrosis visualized. Left Kidney: Length: 10.5 cm. Echogenicity within normal limits. No mass or hydronephrosis visualized. Abdominal aorta: No aneurysm visualized. Other findings: There is ascites. IMPRESSION: 1. Abnormal masses associated with pancreatic head and with the spleen and splenic hilum. This may reflect bulky lymphadenopathy. The findings  associated with the spleen are somewhat tortuous and could reflect thrombosed vessels. 2. Mildly increased hepatic echotexture, slight surface contour irregularity and considerable ascites worrisome for hepatocellular dysfunction either due to primary or metastatic malignancy or to hepatitis. Abdominal and pelvic contrast-enhanced CT scanning is recommended. Electronically Signed   By: David  Martinique M.D.   On: 12/26/2015 09:40   Ct Abdomen Pelvis W Contrast  12/27/2015  CLINICAL DATA:  Abdominal pain with abnormal ultrasound EXAM: CT ABDOMEN AND PELVIS WITH CONTRAST TECHNIQUE: Multidetector CT imaging of the abdomen and pelvis was performed using the standard protocol following bolus administration of intravenous contrast. CONTRAST:  121mL ISOVUE-300 IOPAMIDOL (ISOVUE-300) INJECTION 61% COMPARISON:  12/26/2015, CT from 01/29/2015 FINDINGS: Lung bases are free of acute infiltrate or sizable effusion. A small 4 mm nodule is again noted in the left lower lobe and stable from the prior CT examination. The liver is homogeneous in attenuation. The gallbladder is partially decompressed. The adrenal glands and kidneys are within normal limits. The pancreas is well visualized and there is a hypodense lesion adjacent to the head of the pancreas best seen on image number 30 of series 2. This demonstrates central decreased attenuation which may represent some necrosis. It measures approximately 2.4 cm. This lies immediately adjacent to the head of the pancreas and may be exophytic from the head of the pancreas. This was not present on the prior exam. In the tail of the pancreas there is a multi focal mass lesion which measures approximately 7 by 6.5 cm with similar appearance to that noted in the region of the head of the pancreas. There is also some local invasion into the spleen. These changes have also increased significantly in the interval from the prior exam. The spleen is otherwise within normal limits. Multiple  venous collaterals are noted in the left upper abdomen may which may be related to underlying splenic vein narrowing or occlusion by the mass lesion in the pancreas. Significant ascites is noted. Scattered lymph nodes are noted in the periaortic region particularly on image number 37 of series 2 which measures approximately 15 mm in short axis. Portacaval lymph nodes are also seen as well as lymphadenopathy in the region of the gastrohepatic ligament and celiac axis. Bony structures are within normal limits. IMPRESSION: Changes consistent with a mass in the tail of the pancreas with evidence of in growth into the hilum of the spleen with ascites and significant lymphadenopathy as described. This is consistent with a pancreatic malignancy. There are also noted changes suggestive of splenic vein narrowing or occlusion. Electronically Signed   By: Inez Catalina M.D.   On: 12/27/2015 21:24    EKG: Independently reviewed.  Assessment/Plan Principal Problem:   Pancreatic mass Active Problems:  Ascites   Likely Pancreatic cancer - already with lymphadenopathy, splenic invasion, ascites, so likely already at an advanced stage and incurable at time of diagnosis.  As next step patient likely needs biopsy to confirm diagnosis and cell type.  Have put in for IR eval and management to see if they can biopsy something  No PCP at the moment, but was seeing Dr. Amedeo Plenty in GI office.  Also getting SW consult due to patient likely needing disability in near future.  Ascites -  US paracentesis ordered  Fluid for analysis.   DVT prophylaxis: SCDs only due to likely need for biopsy Code Status: Full Family Communication: No family in room, friend who is acting as Optometrist is at bedside Consults called: None Admission status: Admit to inpatient   Etta Quill DO [facility] Pager [phone] from 7PM-7AM  If 7AM-7PM, please contact the day physician for the patient [email or web address] Password  [facility]  12/27/2015, 11:27 PM

## 2015-12-27 NOTE — ED Notes (Signed)
Pt refuses IV until speaking with EDP.

## 2015-12-27 NOTE — ED Notes (Signed)
Hospitalist at the bedside 

## 2015-12-27 NOTE — ED Notes (Signed)
Lab called to add on Mag level to blood samples already sent to lab.

## 2015-12-27 NOTE — ED Notes (Signed)
Per Pt and Friend, Pt is coming from MD office after having [place] yesterday for abdominal pain ands ascites. Pt was told to come to the ED to have CT scan to confirm results of the [place]. Pt complains of generalized abdominal pain. Denies nausea, diarrhea and reports one episode of vomiting three days ago. Hx of being in a refuge camp one year ago and Gastritis

## 2015-12-28 ENCOUNTER — Inpatient Hospital Stay (HOSPITAL_COMMUNITY): Payer: BLUE CROSS/BLUE SHIELD

## 2015-12-28 ENCOUNTER — Encounter (HOSPITAL_COMMUNITY): Payer: Self-pay | Admitting: General Practice

## 2015-12-28 DIAGNOSIS — R7989 Other specified abnormal findings of blood chemistry: Secondary | ICD-10-CM

## 2015-12-28 DIAGNOSIS — R4789 Other speech disturbances: Secondary | ICD-10-CM

## 2015-12-28 DIAGNOSIS — K869 Disease of pancreas, unspecified: Secondary | ICD-10-CM | POA: Diagnosis not present

## 2015-12-28 DIAGNOSIS — I10 Essential (primary) hypertension: Secondary | ICD-10-CM

## 2015-12-28 DIAGNOSIS — R188 Other ascites: Secondary | ICD-10-CM

## 2015-12-28 DIAGNOSIS — K8689 Other specified diseases of pancreas: Secondary | ICD-10-CM

## 2015-12-28 HISTORY — DX: Other specified diseases of pancreas: K86.89

## 2015-12-28 LAB — BODY FLUID CELL COUNT WITH DIFFERENTIAL
LYMPHS FL: 61 %
Monocyte-Macrophage-Serous Fluid: 16 % — ABNORMAL LOW (ref 50–90)
Neutrophil Count, Fluid: 23 % (ref 0–25)
Total Nucleated Cell Count, Fluid: 2082 cu mm — ABNORMAL HIGH (ref 0–1000)

## 2015-12-28 LAB — PROTEIN, BODY FLUID: TOTAL PROTEIN, FLUID: 4.1 g/dL

## 2015-12-28 MED ORDER — TRAMADOL HCL 50 MG PO TABS
50.0000 mg | ORAL_TABLET | Freq: Four times a day (QID) | ORAL | Status: DC | PRN
  Filled 2015-12-28: qty 1

## 2015-12-28 MED ORDER — LIDOCAINE HCL 1 % IJ SOLN
INTRAMUSCULAR | Status: AC
  Administered 2015-12-28: 09:00:00
  Filled 2015-12-28: qty 20

## 2015-12-28 MED ORDER — POTASSIUM CHLORIDE CRYS ER 20 MEQ PO TBCR
40.0000 meq | EXTENDED_RELEASE_TABLET | Freq: Once | ORAL | Status: AC
  Administered 2015-12-28: 40 meq via ORAL

## 2015-12-28 MED ORDER — HYDRALAZINE HCL 20 MG/ML IJ SOLN: 10.0000 mg | Freq: Three times a day (TID) | INTRAMUSCULAR | Status: DC | PRN

## 2015-12-28 MED ORDER — ENSURE ENLIVE PO LIQD
237.0000 mL | Freq: Two times a day (BID) | ORAL | Status: DC
  Administered 2015-12-29 (×2): 237 mL via ORAL

## 2015-12-28 MED ORDER — PROPRANOLOL HCL 10 MG PO TABS
10.0000 mg | ORAL_TABLET | Freq: Three times a day (TID) | ORAL | Status: DC
  Administered 2015-12-28 – 2015-12-29 (×4): 10 mg via ORAL
  Filled 2015-12-28 (×5): qty 1

## 2015-12-28 NOTE — Progress Notes (Signed)
PROGRESS NOTE  Keith Hart [id number] DOB: [birth year] DOA: 12/27/2015 PCP: No primary care provider on file.  HPI/Recap of past 24 hours:  Laying in bed, wife in room Friend Germame in room at translator  Assessment/Plan: Principal Problem:   Pancreatic mass Active Problems:   Ascites  Likely Pancreatic cancer - already with lymphadenopathy, splenic invasion, ascites, so likely already at an advanced stage and incurable at time of diagnosis.  IR eval and management ordered by admitting md, US paracentesis done on 6/28 with cytology pending No PCP at the moment, but was seeing Dr. Amedeo Plenty in GI office.  SW consult due to patient likely needing disability in near future. Patient does not want aggressive treatment, his main goal is to try to get back to his country and be with his family if he does not have much time left, he has not thought about code status in the past. All conversation was held through translator who is a personal friend , his wife are present in the room as well.  Palliative team consulted as well.  Ascites - US paracentesis on 6/28 with 1.2liter of fluids removed Fluid wbc 2082, but patient denies pain, no fever, no leukocytosis, will hold off on abx for now             Cytology pending  lft elevation: negative hepatitis panel, negative HIV, does report history of alcohol use.  HTN: start propranolol  DVT prophylaxis: SCDs Code Status: Full Family Communication: wife and, friend who is acting as Optometrist is at bedside Consults called: IR and palliative care  Disposition Plan: pending   Procedures:  paracentesis  Antibiotics:  none   Objective: BP 169/96 mmHg  Pulse 87  Temp(Src) 98.6 F (37 C) (Oral)  Resp 20  Ht 5\' 5"  (1.651 m)  Wt 70.217 kg (154 lb 12.8 oz)  BMI 25.76 kg/m2  SpO2 99% No intake or output data in the 24 hours ending 12/28/15 1207 Filed  Weights   12/27/15 1605 12/28/15 0054  Weight: 71.442 kg (157 lb 8 oz) 70.217 kg (154 lb 12.8 oz)    Exam:   General:  Weak but NAD  Cardiovascular: RRR  Respiratory: CTABL  Abdomen:  Distended, Soft/NT, positive BS  Musculoskeletal: No Edema  Neuro: aaox3  Data Reviewed: Basic Metabolic Panel:  Recent Labs Lab 12/27/15 1615  NA 133*  K 2.8*  CL 103  CO2 21*  GLUCOSE 140*  BUN <5*  CREATININE 0.84  CALCIUM 8.9  MG 2.1   Liver Function Tests:  Recent Labs Lab 12/27/15 1615  AST 114*  ALT 64*  ALKPHOS 165*  BILITOT 1.0  PROT 7.8  ALBUMIN 3.2*    Recent Labs Lab 12/27/15 1615  LIPASE 60*   No results for input(s): AMMONIA in the last 168 hours. CBC:  Recent Labs Lab 12/27/15 1615  WBC 4.8  HGB 15.0  HCT 42.8  MCV 85.6  PLT 206   Cardiac Enzymes:   No results for input(s): CKTOTAL, CKMB, CKMBINDEX, TROPONINI in the last 168 hours. BNP (last 3 results) No results for input(s): BNP in the last 8760 hours.  ProBNP (last 3 results) No results for input(s): PROBNP in the last 8760 hours.  CBG: No results for input(s): GLUCAP in the last 168 hours.  No results found for this or any previous visit (from the past 240 hour(s)).   Studies: Dg Chest 2 View  12/27/2015  CLINICAL DATA:  Cough for 1 month EXAM: CHEST  2 VIEW COMPARISON:  12/21/2015 FINDINGS: The heart size and mediastinal contours are within normal limits. Both lungs are clear. The visualized skeletal structures are unremarkable. IMPRESSION: No active cardiopulmonary disease. Electronically Signed   By: Andreas Newport M.D.   On: 12/27/2015 21:30   Ct Abdomen Pelvis W Contrast  12/27/2015  CLINICAL DATA:  Abdominal pain with abnormal ultrasound EXAM: CT ABDOMEN AND PELVIS WITH CONTRAST TECHNIQUE: Multidetector CT imaging of the abdomen and pelvis was performed using the standard protocol following bolus administration of intravenous contrast. CONTRAST:  154mL ISOVUE-300 IOPAMIDOL  (ISOVUE-300) INJECTION 61% COMPARISON:  12/26/2015, CT from 01/29/2015 FINDINGS: Lung bases are free of acute infiltrate or sizable effusion. A small 4 mm nodule is again noted in the left lower lobe and stable from the prior CT examination. The liver is homogeneous in attenuation. The gallbladder is partially decompressed. The adrenal glands and kidneys are within normal limits. The pancreas is well visualized and there is a hypodense lesion adjacent to the head of the pancreas best seen on image number 30 of series 2. This demonstrates central decreased attenuation which may represent some necrosis. It measures approximately 2.4 cm. This lies immediately adjacent to the head of the pancreas and may be exophytic from the head of the pancreas. This was not present on the prior exam. In the tail of the pancreas there is a multi focal mass lesion which measures approximately 7 by 6.5 cm with similar appearance to that noted in the region of the head of the pancreas. There is also some local invasion into the spleen. These changes have also increased significantly in the interval from the prior exam. The spleen is otherwise within normal limits. Multiple venous collaterals are noted in the left upper abdomen may which may be related to underlying splenic vein narrowing or occlusion by the mass lesion in the pancreas. Significant ascites is noted. Scattered lymph nodes are noted in the periaortic region particularly on image number 37 of series 2 which measures approximately 15 mm in short axis. Portacaval lymph nodes are also seen as well as lymphadenopathy in the region of the gastrohepatic ligament and celiac axis. Bony structures are within normal limits. IMPRESSION: Changes consistent with a mass in the tail of the pancreas with evidence of in growth into the hilum of the spleen with ascites and significant lymphadenopathy as described. This is consistent with a pancreatic malignancy. There are also noted changes  suggestive of splenic vein narrowing or occlusion. Electronically Signed   By: Inez Catalina M.D.   On: 12/27/2015 21:24    Scheduled Meds: . lidocaine      . pantoprazole  40 mg Oral Daily  . potassium chloride  40 mEq Oral Once    Continuous Infusions:    Time spent: 40mins, more than 50% time spent on coordination of care and communicating with patient through translator  Dantonio Justen MD, PhD  [facility] Pager [phone]. If 7PM-7AM, please contact night-coverage at [email or web address], password [facility] 12/28/2015, 12:07 PM  LOS: 1 day

## 2015-12-28 NOTE — Progress Notes (Signed)
CSW received consult regarding FMLA paperwork for patient. CSW met with patient and patient's friend at bedside who served as Optometrist since patient does not speak [place]. 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 CSW instructed patient's friend that patient needs to contact his employer's HR department for [facility] paperwork. Patient's friend stated he would contact them, but that patient does not have a PCP to fill out paperwork. CSW offered to contact hospital MD to see if they are able to fill out FMLA paperwork once CSW receives it.  Percell Locus Kresha Abelson LCSWA [phone]

## 2015-12-28 NOTE — Procedures (Signed)
Successful US guided paracentesis from right lateral abdomen.  Yielded 1.2 liters of clear yellow fluid.  No immediate complications.  Pt tolerated well.   Specimen was sent for labs.  Jesson Foskey S Lyrika Souders PA-C 12/28/2015 8:58 AM

## 2015-12-29 ENCOUNTER — Other Ambulatory Visit: Payer: Self-pay

## 2015-12-29 ENCOUNTER — Other Ambulatory Visit: Payer: BLUE CROSS/BLUE SHIELD

## 2015-12-29 DIAGNOSIS — Z515 Encounter for palliative care: Secondary | ICD-10-CM | POA: Diagnosis not present

## 2015-12-29 DIAGNOSIS — R188 Other ascites: Secondary | ICD-10-CM | POA: Diagnosis not present

## 2015-12-29 DIAGNOSIS — R1011 Right upper quadrant pain: Secondary | ICD-10-CM | POA: Diagnosis not present

## 2015-12-29 DIAGNOSIS — Z7189 Other specified counseling: Secondary | ICD-10-CM | POA: Insufficient documentation

## 2015-12-29 DIAGNOSIS — Z66 Do not resuscitate: Secondary | ICD-10-CM | POA: Insufficient documentation

## 2015-12-29 DIAGNOSIS — I10 Essential (primary) hypertension: Secondary | ICD-10-CM | POA: Diagnosis not present

## 2015-12-29 DIAGNOSIS — K869 Disease of pancreas, unspecified: Secondary | ICD-10-CM | POA: Diagnosis not present

## 2015-12-29 LAB — COMPREHENSIVE METABOLIC PANEL
ALT: 43 U/L (ref 17–63)
AST: 74 U/L — AB (ref 15–41)
Albumin: 2.7 g/dL — ABNORMAL LOW (ref 3.5–5.0)
Alkaline Phosphatase: 125 U/L (ref 38–126)
Anion gap: 6 (ref 5–15)
BUN: 7 mg/dL (ref 6–20)
CHLORIDE: 104 mmol/L (ref 101–111)
CO2: 24 mmol/L (ref 22–32)
CREATININE: 0.83 mg/dL (ref 0.61–1.24)
Calcium: 8.5 mg/dL — ABNORMAL LOW (ref 8.9–10.3)
GFR calc Af Amer: >60 mL/min (ref >60)
GFR calc non Af Amer: >60 mL/min (ref >60)
Glucose, Bld: 93 mg/dL (ref 65–99)
Potassium: 3.5 mmol/L (ref 3.5–5.1)
SODIUM: 134 mmol/L — AB (ref 135–145)
Total Bilirubin: 0.7 mg/dL (ref 0.3–1.2)
Total Protein: 6.6 g/dL (ref 6.5–8.1)

## 2015-12-29 LAB — LIPASE, BLOOD: Lipase: 53 U/L — ABNORMAL HIGH (ref 11–51)

## 2015-12-29 MED ORDER — PROPRANOLOL HCL 10 MG PO TABS: 10.0000 mg | ORAL_TABLET | Freq: Three times a day (TID) | ORAL | Status: AC

## 2015-12-29 MED ORDER — OXYCODONE-ACETAMINOPHEN 5-325 MG PO TABS
1.0000 | ORAL_TABLET | Freq: Four times a day (QID) | ORAL | Status: DC
  Administered 2015-12-29: 1 via ORAL
  Filled 2015-12-29: qty 1

## 2015-12-29 MED ORDER — ENSURE ENLIVE PO LIQD: 237.0000 mL | Freq: Two times a day (BID) | ORAL | Status: AC

## 2015-12-29 MED ORDER — SPIRONOLACTONE 100 MG PO TABS: 100.0000 mg | ORAL_TABLET | Freq: Every day | ORAL | Status: AC

## 2015-12-29 MED ORDER — SPIRONOLACTONE 100 MG PO TABS: 100.0000 mg | ORAL_TABLET | Freq: Every day | ORAL | Status: DC

## 2015-12-29 MED ORDER — FUROSEMIDE 40 MG PO TABS: 40.0000 mg | ORAL_TABLET | Freq: Every day | ORAL | Status: AC

## 2015-12-29 MED ORDER — OXYCODONE-ACETAMINOPHEN 5-325 MG PO TABS: 1.0000 | ORAL_TABLET | Freq: Four times a day (QID) | ORAL | Status: DC

## 2015-12-29 MED ORDER — ENSURE ENLIVE PO LIQD: 237.0000 mL | Freq: Two times a day (BID) | ORAL | Status: DC

## 2015-12-29 MED ORDER — SPIRONOLACTONE 25 MG PO TABS
100.0000 mg | ORAL_TABLET | Freq: Every day | ORAL | Status: DC
  Administered 2015-12-29: 100 mg via ORAL
  Filled 2015-12-29: qty 4

## 2015-12-29 MED ORDER — FUROSEMIDE 40 MG PO TABS
40.0000 mg | ORAL_TABLET | Freq: Once | ORAL | Status: AC
  Administered 2015-12-29: 40 mg via ORAL
  Filled 2015-12-29: qty 1

## 2015-12-29 NOTE — Discharge Summary (Addendum)
Discharge Summary  Keith Hart [id number] DOB: 03/07/1971  PCP: No PCP Per Patient  Admit date: 12/27/2015 Discharge date: 12/29/2015  Time spent: >51mins, more than 50% percent of time spent on coordination of care  Recommendations for Outpatient Follow-up:  1. F/u with PMD within a week  for hospital discharge follow up, repeat cbc/bmp at follow up 2. F/u with [facility] dr Amedeo Plenty for ascites.    Discharge Diagnoses:  Active Hospital Problems   Diagnosis Date Noted  . Pancreatic mass 12/27/2015  . Ascites 12/27/2015    Resolved Hospital Problems   Diagnosis Date Noted Date Resolved  No resolved problems to display.    Discharge Condition: stable  Diet recommendation: regular diet  Filed Weights   12/27/15 1605 12/28/15 0054  Weight: 71.442 kg (157 lb 8 oz) 70.217 kg (154 lb 12.8 oz)    History of present illness:  HPI: Keith Hart is a 45 y.o. male with medical history significant of being in refugee camp in [place] for 20 years before finally moving here 1 year ago. His friend who is with him is translating. Patient comes in from Dr. Amedeo Plenty (GI) office with concern for abdominal distention, ascites, non stop coughing at night. Symptoms onset over 1 month ago, have been worsening, are associated with poor PO intake. Saw Dr. Amedeo Plenty at office for symptoms, had abdominal ultrasound which was worrisome for metastatic cancer findings and so got sent to ED for CT scan.  ED Course: CT sadly shows what appears to be pancreatic cancer with lymph node mets, pancreatic head lesion, pancreatic tail lesion that is invading spleen.  Hospital Course:  Principal Problem:   Pancreatic mass Active Problems:   Ascites   Likely Pancreatic cancer - already with lymphadenopathy, splenic invasion, ascites, so likely already at an advanced stage and incurable at time of diagnosis.  IR eval and management ordered by admitting md, US paracentesis done  on 6/28 with cytology pending No PCP at the moment, but was seeing Dr. Amedeo Plenty in GI office.  SW consult due to patient likely needing disability in near future. Patient does not want aggressive treatment, his main goal is to try to get back to his country and be with his family if he does not have much time left,    Palliative team consulted, patient again expressed wishes of no aggressive treatment, he confirmed with palliative team that he wants to be DNR. Please see palliative team notes Patient initially agreed to [facility] GI consult,  but patients later declined the consult, he wants to be discharged today and outpatient follow up with Dr Amedeo Plenty, he is aware of the pending cytology result and and tumor marker result, patient is aware of consider EUS biopsy if cytology is negative.  Ascites - US paracentesis on 6/28 with 1.2liter of fluids removed Fluid wbc 2082, but patient denies pain, no fever, no leukocytosis, will hold off on abx for now  Cytology pending             He is discharged with spironolactone and lasix/  lft elevation: negative hepatitis panel, negative HIV, does report history of alcohol use.  HTN: start propranolol      Code Status: DNR Family Communication: patient and a friend who is acting as Optometrist is at bedside Consults called: IR /[facility] Dr schooler/ palliative care  Disposition Plan: pending   Procedures:  Paracentesis on 6/28  Antibiotics:  none   Discharge Exam: BP 135/96 mmHg  Pulse 78  Temp(Src) 98.2 F (36.8 C) (Oral)  Resp 18  Ht 5\' 5"  (1.651 m)  Wt 70.217 kg (154 lb 12.8 oz)  BMI 25.76 kg/m2  SpO2 100%    General: Weak but NAD  Cardiovascular: RRR  Respiratory: CTABL  Abdomen: Distended, Soft/NT, positive BS  Musculoskeletal: No Edema  Neuro: aaox3    Discharge Instructions You were cared for by a hospitalist during your hospital stay. If you have any  questions about your discharge medications or the care you received while you were in the hospital after you are discharged, you can call the unit and asked to speak with the hospitalist on call if the hospitalist that took care of you is not available. Once you are discharged, your primary care physician will handle any further medical issues. Please note that NO REFILLS for any discharge medications will be authorized once you are discharged, as it is imperative that you return to your primary care physician (or establish a relationship with a primary care physician if you do not have one) for your aftercare needs so that they can reassess your need for medications and monitor your lab values.      Discharge Instructions    Discharge instructions    Complete by:  As directed   Regular diet     Increase activity slowly    Complete by:  As directed             Medication List    STOP taking these medications        amoxicillin 500 MG capsule  Commonly known as:  AMOXIL     clarithromycin 500 MG tablet  Commonly known as:  BIAXIN      TAKE these medications        feeding supplement (ENSURE ENLIVE) Liqd  Take 237 mLs by mouth 2 (two) times daily between meals.     furosemide 40 MG tablet  Commonly known as:  LASIX  Take 1 tablet (40 mg total) by mouth daily.     lansoprazole 30 MG capsule  Commonly known as:  PREVACID  Take 30 mg by mouth 2 (two) times daily before a meal.     oxyCODONE-acetaminophen 5-325 MG tablet  Commonly known as:  PERCOCET/ROXICET  Take 1 tablet by mouth every 6 (six) hours.     propranolol 10 MG tablet  Commonly known as:  INDERAL  Take 1 tablet (10 mg total) by mouth 3 (three) times daily.     spironolactone 100 MG tablet  Commonly known as:  ALDACTONE  Take 1 tablet (100 mg total) by mouth daily.       No Known Allergies Follow-up Information    Follow up with [facility] In 1 week.   Why:  primary care  physician, repeat cbc/bmp while on diuretics   Contact information:   [address] [place] [phone]      Follow up with [facility] In 1 week.   Specialty:  Oncology   Contact information:   [place] [id number] [place] [phone]      Follow up with Missy Sabins, MD.   Specialty:  Gastroenterology   Why:  for ascites, please folow up on final cytology result and tumor marker tests results, consider EUS biopsy if cytology negative   Contact information:   [address] [place] [address] [phone]  The results of significant diagnostics from this hospitalization (including imaging, microbiology, ancillary and laboratory) are listed below for reference.    Significant Diagnostic Studies: Dg Chest 2 View  12/27/2015  CLINICAL DATA:  Cough for 1 month EXAM: CHEST  2 VIEW COMPARISON:  12/21/2015 FINDINGS: The heart size and mediastinal contours are within normal limits. Both lungs are clear. The visualized skeletal structures are unremarkable. IMPRESSION: No active cardiopulmonary disease. Electronically Signed   By: Andreas Newport M.D.   On: 12/27/2015 21:30   US Abdomen Complete  12/26/2015  CLINICAL DATA:  Six weeks of abdominal distension, elevated liver function studies EXAM: ABDOMEN ULTRASOUND COMPLETE COMPARISON:  Abdominal and pelvic CT scan of January 29, 2015 FINDINGS: Gallbladder: No gallstones or wall thickening visualized. No sonographic Murphy sign noted by sonographer. Common bile duct: Diameter: 3.6 mm Liver: The hepatic echotexture is mildly increased. There is no focal mass nor ductal dilation. There is a moderate amount of ascites however. The surface contour of the liver is slightly irregular. IVC: No abnormality visualized. Pancreas: The pancreatic body is grossly normal. There are masslike within or adjacent to the pancreatic  head with the largest measuring 2.3 cm in greatest dimension. The pancreatic tail is not well visualized. Spleen: Multiple masses lie within the splenic hilum and appear to invade the spleen. The spleen itself measures 9.5 cm in length. Right Kidney: Length: 10.5 cm. Echogenicity within normal limits. No mass or hydronephrosis visualized. Left Kidney: Length: 10.5 cm. Echogenicity within normal limits. No mass or hydronephrosis visualized. Abdominal aorta: No aneurysm visualized. Other findings: There is ascites. IMPRESSION: 1. Abnormal masses associated with pancreatic head and with the spleen and splenic hilum. This may reflect bulky lymphadenopathy. The findings associated with the spleen are somewhat tortuous and could reflect thrombosed vessels. 2. Mildly increased hepatic echotexture, slight surface contour irregularity and considerable ascites worrisome for hepatocellular dysfunction either due to primary or metastatic malignancy or to hepatitis. Abdominal and pelvic contrast-enhanced CT scanning is recommended. Electronically Signed   By: David  Martinique M.D.   On: 12/26/2015 09:40   Ct Abdomen Pelvis W Contrast  12/27/2015  CLINICAL DATA:  Abdominal pain with abnormal ultrasound EXAM: CT ABDOMEN AND PELVIS WITH CONTRAST TECHNIQUE: Multidetector CT imaging of the abdomen and pelvis was performed using the standard protocol following bolus administration of intravenous contrast. CONTRAST:  124mL ISOVUE-300 IOPAMIDOL (ISOVUE-300) INJECTION 61% COMPARISON:  12/26/2015, CT from 01/29/2015 FINDINGS: Lung bases are free of acute infiltrate or sizable effusion. A small 4 mm nodule is again noted in the left lower lobe and stable from the prior CT examination. The liver is homogeneous in attenuation. The gallbladder is partially decompressed. The adrenal glands and kidneys are within normal limits. The pancreas is well visualized and there is a hypodense lesion adjacent to the head of the pancreas best seen on  image number 30 of series 2. This demonstrates central decreased attenuation which may represent some necrosis. It measures approximately 2.4 cm. This lies immediately adjacent to the head of the pancreas and may be exophytic from the head of the pancreas. This was not present on the prior exam. In the tail of the pancreas there is a multi focal mass lesion which measures approximately 7 by 6.5 cm with similar appearance to that noted in the region of the head of the pancreas. There is also some local invasion into the spleen. These changes have also increased significantly in the interval from the prior exam. The spleen is otherwise within normal  limits. Multiple venous collaterals are noted in the left upper abdomen may which may be related to underlying splenic vein narrowing or occlusion by the mass lesion in the pancreas. Significant ascites is noted. Scattered lymph nodes are noted in the periaortic region particularly on image number 37 of series 2 which measures approximately 15 mm in short axis. Portacaval lymph nodes are also seen as well as lymphadenopathy in the region of the gastrohepatic ligament and celiac axis. Bony structures are within normal limits. IMPRESSION: Changes consistent with a mass in the tail of the pancreas with evidence of in growth into the hilum of the spleen with ascites and significant lymphadenopathy as described. This is consistent with a pancreatic malignancy. There are also noted changes suggestive of splenic vein narrowing or occlusion. Electronically Signed   By: Inez Catalina M.D.   On: 12/27/2015 21:24   US Paracentesis  12/28/2015  INDICATION: Pancreatic mass. Ascites. Request for diagnostic and therapeutic paracentesis. EXAM: ULTRASOUND GUIDED RIGHT LATERAL ABDOMEN PARACENTESIS MEDICATIONS: 1% Lidocaine. COMPLICATIONS: None immediate. PROCEDURE: Informed written consent was obtained from the patient after a discussion of the risks, benefits and alternatives to  treatment. A timeout was performed prior to the initiation of the procedure. Initial ultrasound scanning demonstrates a moderate amount of ascites within the right lower abdominal quadrant. The right lower abdomen was prepped and draped in the usual sterile fashion. 1% lidocaine with epinephrine was used for local anesthesia. Following this, a 19 gauge, 7-cm, Yueh catheter was introduced. An ultrasound image was saved for documentation purposes. The paracentesis was performed. The catheter was removed and a dressing was applied. The patient tolerated the procedure well without immediate post procedural complication. FINDINGS: A total of approximately 11.2 liters of clear yellow fluid was removed. Samples were sent to the laboratory as requested by the clinical team. IMPRESSION: Successful ultrasound-guided paracentesis yielding 1.2 liters of peritoneal fluid. Read by:  Gareth Eagle, PA-C Electronically Signed   By: Jacqulynn Cadet M.D.   On: 12/28/2015 08:57   Dg Abd Acute W/chest  12/21/2015  CLINICAL DATA:  Abdominal distension, constipation, and nonproductive cough for the past month EXAM: DG ABDOMEN ACUTE W/ 1V CHEST COMPARISON:  Abdominal pelvic CT scan of January 29, 2015 and chest x-ray of Nov 30, 2014. FINDINGS: Chest x-ray: The lungs are adequately inflated and clear. The heart and pulmonary vascularity are normal. The mediastinum is normal in width. There is no pleural effusion. The trachea is midline. The bony thorax exhibits no acute abnormality. Within the abdomen there are loops of minimally distended small bowel containing gas. There is gas in the ascending and transverse portions of the colon. No abnormal distention is observed. There is no significant gas in the descending colon and only a small amount in the rectum. There are no free extraluminal gas collections. There are no abnormal soft tissue calcifications. The bony structures exhibit no acute abnormalities. IMPRESSION: 1. Fairly nonspecific  bowel gas pattern with somewhat more small-bowel gas than normally expected. There is no evidence of obstruction or perforation. No excessive stool burden is observed. 2. No acute cardiopulmonary abnormality. Electronically Signed   By: David  Martinique M.D.   On: 12/21/2015 13:20    Microbiology: No results found for this or any previous visit (from the past 240 hour(s)).   Labs: Basic Metabolic Panel:  Recent Labs Lab 12/27/15 1615 12/29/15 0814  NA 133* 134*  K 2.8* 3.5  CL 103 104  CO2 21* 24  GLUCOSE 140* 93  BUN <  5* 7  CREATININE 0.84 0.83  CALCIUM 8.9 8.5*  MG 2.1  --    Liver Function Tests:  Recent Labs Lab 12/27/15 1615 12/29/15 0814  AST 114* 74*  ALT 64* 43  ALKPHOS 165* 125  BILITOT 1.0 0.7  PROT 7.8 6.6  ALBUMIN 3.2* 2.7*    Recent Labs Lab 12/27/15 1615 12/29/15 0814  LIPASE 60* 53*   No results for input(s): AMMONIA in the last 168 hours. CBC:  Recent Labs Lab 12/27/15 1615  WBC 4.8  HGB 15.0  HCT 42.8  MCV 85.6  PLT 206   Cardiac Enzymes: No results for input(s): CKTOTAL, CKMB, CKMBINDEX, TROPONINI in the last 168 hours. BNP: BNP (last 3 results) No results for input(s): BNP in the last 8760 hours.  ProBNP (last 3 results) No results for input(s): PROBNP in the last 8760 hours.  CBG: No results for input(s): GLUCAP in the last 168 hours.     SignedFlorencia Reasons MD, PhD  [facility] 12/29/2015, 5:42 PM

## 2015-12-29 NOTE — Consult Note (Signed)
Consultation Note Date: 12/29/2015   Patient Name: Keith Hart  DOB: February 06, 1971  MRN: [id number]  Age / Sex: 45 y.o., male  PCP: No Pcp Per Patient Referring Physician: No att. providers found:  Dr. Florencia Reasons  Reason for Consultation: Goals of Care and the introduction of Palliative Services  HPI/Patient Profile: 45 y.o. male  with past medical history of GERD and Hypertension admitted on 12/27/2015 with abdominal pain and swelling.  Imaging showed a pancreatic tail mass with likely metastasis to the liver, and spleen as well as ascites.  The patient received paracentesis.  GI and Palliative Medicine were consulted.  The patient has seen Dr. Amedeo Plenty recently for abdominal swelling.  Dr. Amedeo Plenty recommended imaging.  Clinical Assessment and Goals of Care: The meeting with Mr. Pefley was facilitated by a translator Brenton Grills) as the patient does not speak [place].  Jermaine related to me that Mr. Pollak and his wife spent 20+ years in a refugee camp with no electricity or plumbing.  They came to this country 1 year ago and now both work on a [place].  The patient and his wife reportedly ride 2 hours to work in a [place], work for 8-10 hours and then ride two hours home.  They have no children and they have been together since they were 45 years old.  The patient tells me he has a strong belief in God and he wants to die naturally without any intervention.  He is [place] Orthodox.  He wants to return to his home country to see the family he has not been able to visit in over 20 years.  Jermaine ask about chemo, radiation, prognosis.  I explained that an Oncologist would be the appropriate person to speak with for that information and I went on to explain the process of working with Dr. Amedeo Plenty' group to obtain a biopsy via EUS - the results of the biopsy would be reviewed by Oncology, who could then be able to give Mr.  Shams better information.    Jermaine relayed this information about biopsy to Mr. Chidester who stated he wanted none of it.  He only wanted to return home to die naturally the way God intends.  I asked Mr. Shaban about code status specifically.  He wants no CPR/Intubation/ACLS.  He wants to be allowed to die naturally.  "If I die tomorrow I'm ok with that".  Mr. Gutherie stated that he wanted to go home this evening.  He does not want any further work up or consultation.  Jermaine requested 2 letters from me.  The first letter for Mr. Troutman wife excusing her from work to care for him and the second letter asking for wheel chair assistance for Mr. Sandusky when he travels.  Brenton Grills was specific about the wording of the letters.   I provided them to the patient.   The patient himself is the primary decision maker    SUMMARY OF RECOMMENDATIONS   DNR/DNI Supportive treatment only.   Percocet 5-325 q6 PRN  for pain. Patient does not want work up for his pancreatic mass.  He intends to start his journey home within the next 1-2 weeks.  Code Status/Advance Care Planning:  DNR    Symptom Management:   Percocet 5 mg q6 PRN pain  Agree with Aldactone to attempt to slow recurrent ascites   Additional Recommendations (Limitations, Scope, Preferences):  Comfort Measures Onlly  Psycho-social/Spiritual:   Desire for further Chaplaincy support:  The patient personal pastor was meeting him this evening.  Additional Recommendations: Community support resources  Prognosis:   Unable to determine.  Discharge Planning: to home with home health services if available.     Primary Diagnoses: Present on Admission:  . Pancreatic mass . Ascites  I have reviewed the medical record, interviewed the patient and family, and examined the patient. The following aspects are pertinent.  Past Medical History  Diagnosis Date  . Hypertension   . Gastritis   . GERD (gastroesophageal reflux disease)     . Migraine     "alot; due to coughing" (12/28/2015)  . Daily headache   . Renal disorder     Pt reports Kidney problems intermittenlty. Unknown specifics  . Pancreatic mass 12/28/2015   Social History   Social History  . Marital Status: Married    Spouse Name: N/A  . Number of Children: N/A  . Years of Education: N/A   Social History Main Topics  . Smoking status: Former Smoker -- 0.50 packs/day for 6 years    Types: Cigarettes  . Smokeless tobacco: Never Used     Comment: "quit smoking in ~ 1995  . Alcohol Use: 12.0 oz/week    20 Cans of beer per week     Comment: 12/28/2015 "3, 40oz beers, 2 days/week"  . Drug Use: No  . Sexual Activity: No   Other Topics Concern  . None   Social History Narrative   Family History  Problem Relation Age of Onset  . Hypertension Mother    Scheduled Meds: . feeding supplement (ENSURE ENLIVE)  237 mL Oral BID BM  . oxyCODONE-acetaminophen  1 tablet Oral Q6H  . pantoprazole  40 mg Oral Daily  . propranolol  10 mg Oral TID  . spironolactone  100 mg Oral Daily   Continuous Infusions:  PRN Meds:.hydrALAZINE Medications Prior to Admission:  Prior to Admission medications   Medication Sig Start Date End Date Taking? Authorizing Provider  lansoprazole (PREVACID) 30 MG capsule Take 30 mg by mouth 2 (two) times daily before a meal.   Yes Historical Provider, MD  feeding supplement, ENSURE ENLIVE, (ENSURE ENLIVE) LIQD Take 237 mLs by mouth 2 (two) times daily between meals. 12/29/15   Florencia Reasons, MD  furosemide (LASIX) 40 MG tablet Take 1 tablet (40 mg total) by mouth daily. 12/29/15   Florencia Reasons, MD  oxyCODONE-acetaminophen (PERCOCET/ROXICET) 5-325 MG tablet Take 1 tablet by mouth every 6 (six) hours. 12/29/15   Florencia Reasons, MD  propranolol (INDERAL) 10 MG tablet Take 1 tablet (10 mg total) by mouth 3 (three) times daily. 12/29/15   Florencia Reasons, MD  spironolactone (ALDACTONE) 100 MG tablet Take 1 tablet (100 mg total) by mouth daily. 12/29/15   Florencia Reasons, MD    No Known Allergies Review of Systems:  Patient only complained of right sided abdominal pain and swelling.  Physical Exam  Wd, thin black male with slightly ashen complexion.  Very pleasant CV rrr Resp not labored Abdomen firm, slightly distended, tender to palpation in the RUQ  and epigastric area, +bs Ext able to move all 4  Vital Signs: BP 135/96 mmHg  Pulse 78  Temp(Src) 98.2 F (36.8 C) (Oral)  Resp 18  Ht 5\' 5"  (1.651 m)  Wt 70.217 kg (154 lb 12.8 oz)  BMI 25.76 kg/m2  SpO2 100% Pain Assessment: 0-10   Pain Score: Asleep   SpO2: SpO2: 100 % O2 Device:SpO2: 100 % O2 Flow Rate: .   IO: Intake/output summary: No intake or output data in the 24 hours ending 12/29/15 1903  LBM: Last BM Date: 12/27/15 Baseline Weight: Weight: 71.442 kg (157 lb 8 oz) Most recent weight: Weight: 70.217 kg (154 lb 12.8 oz)     Palliative Assessment/Data:   PPS:  50%  Time In: 3:30 Time Out: 4:45 Time Total: 75 minutes. Greater than 50%  of this time was spent counseling and coordinating care related to the above assessment and plan.  Discussed case with [place] attending, Dr. Erlinda Hong.  Signed by: Imogene Burn, PA-C Palliative Medicine Pager: [phone]    Please contact Palliative Medicine Team phone at [phone] for questions and concerns.  For individual provider: See Shea Evans

## 2015-12-29 NOTE — Progress Notes (Addendum)
NURSING PROGRESS NOTE  Keith Hart [id number] Discharge Data: 12/29/2015 6:08 PM Attending Provider: Florencia Reasons, MD PCP:No PCP Per Patient     [name] to be D/C'd Home per Xu,MD order.  Discussed with the patient and his friend who was interpreting the After Visit Summary and all questions fully answered. Pt refused to use our interpreter services and insisted that his friend interpret his discharge instructions for him. All IV's discontinued with no bleeding noted. All belongings returned to patient for patient to take home. Pt given prescription for pain pill as well as information on his new medications. Pt refused to be wheeled outside. Pt walking with friend to car. Pt refused home health services. Paperwork given go pt from m. York also  Last Vital Signs:  Blood pressure 135/96, pulse 78, temperature 98.2 F (36.8 C), temperature source Oral, resp. rate 18, height 5\' 5"  (1.651 m), weight 70.217 kg (154 lb 12.8 oz), SpO2 100 %.  Discharge Medication List   Medication List    STOP taking these medications        amoxicillin 500 MG capsule  Commonly known as:  AMOXIL     clarithromycin 500 MG tablet  Commonly known as:  BIAXIN      TAKE these medications        feeding supplement (ENSURE ENLIVE) Liqd  Take 237 mLs by mouth 2 (two) times daily between meals.     furosemide 40 MG tablet  Commonly known as:  LASIX  Take 1 tablet (40 mg total) by mouth daily.     lansoprazole 30 MG capsule  Commonly known as:  PREVACID  Take 30 mg by mouth 2 (two) times daily before a meal.     oxyCODONE-acetaminophen 5-325 MG tablet  Commonly known as:  PERCOCET/ROXICET  Take 1 tablet by mouth every 6 (six) hours.     propranolol 10 MG tablet  Commonly known as:  INDERAL  Take 1 tablet (10 mg total) by mouth 3 (three) times daily.     spironolactone 100 MG tablet  Commonly known as:  ALDACTONE  Take 1 tablet (100 mg total) by mouth daily.

## 2015-12-30 LAB — AFP TUMOR MARKER: AFP TUMOR MARKER: 6.4 ng/mL (ref 0.0–8.3)

## 2015-12-30 LAB — CANCER ANTIGEN 19-9: CA 19 9: 1 U/mL (ref 0–35)

## 2016-01-23 ENCOUNTER — Observation Stay (HOSPITAL_COMMUNITY)
Admission: EM | Admit: 2016-01-23 | Discharge: 2016-01-24 | Disposition: A | Discharge status: Home / Self Care | Payer: BLUE CROSS/BLUE SHIELD | Attending: Internal Medicine | Admitting: Internal Medicine

## 2016-01-23 ENCOUNTER — Emergency Department (HOSPITAL_COMMUNITY): Payer: BLUE CROSS/BLUE SHIELD

## 2016-01-23 DIAGNOSIS — R112 Nausea with vomiting, unspecified: Secondary | ICD-10-CM | POA: Diagnosis not present

## 2016-01-23 DIAGNOSIS — R1032 Left lower quadrant pain: Secondary | ICD-10-CM | POA: Diagnosis present

## 2016-01-23 DIAGNOSIS — I10 Essential (primary) hypertension: Secondary | ICD-10-CM | POA: Diagnosis not present

## 2016-01-23 DIAGNOSIS — R6510 Systemic inflammatory response syndrome (SIRS) of non-infectious origin without acute organ dysfunction: Secondary | ICD-10-CM | POA: Diagnosis not present

## 2016-01-23 DIAGNOSIS — A419 Sepsis, unspecified organism: Principal | ICD-10-CM | POA: Insufficient documentation

## 2016-01-23 DIAGNOSIS — R188 Other ascites: Secondary | ICD-10-CM | POA: Diagnosis not present

## 2016-01-23 DIAGNOSIS — K8689 Other specified diseases of pancreas: Secondary | ICD-10-CM | POA: Diagnosis present

## 2016-01-23 DIAGNOSIS — N390 Urinary tract infection, site not specified: Secondary | ICD-10-CM | POA: Diagnosis present

## 2016-01-23 DIAGNOSIS — Z87891 Personal history of nicotine dependence: Secondary | ICD-10-CM | POA: Diagnosis not present

## 2016-01-23 DIAGNOSIS — R109 Unspecified abdominal pain: Secondary | ICD-10-CM

## 2016-01-23 LAB — COMPREHENSIVE METABOLIC PANEL
ALBUMIN: 2.5 g/dL — AB (ref 3.5–5.0)
ALK PHOS: 198 U/L — AB (ref 38–126)
ALT: 66 U/L — AB (ref 17–63)
AST: 164 U/L — AB (ref 15–41)
Anion gap: 9 (ref 5–15)
BILIRUBIN TOTAL: 1.5 mg/dL — AB (ref 0.3–1.2)
BUN: 9 mg/dL (ref 6–20)
CALCIUM: 7.8 mg/dL — AB (ref 8.9–10.3)
CO2: 23 mmol/L (ref 22–32)
CREATININE: 0.84 mg/dL (ref 0.61–1.24)
Chloride: 97 mmol/L — ABNORMAL LOW (ref 101–111)
GFR calc Af Amer: >60 mL/min (ref >60)
GLUCOSE: 112 mg/dL — AB (ref 65–99)
POTASSIUM: 2.7 mmol/L — AB (ref 3.5–5.1)
Sodium: 129 mmol/L — ABNORMAL LOW (ref 135–145)
TOTAL PROTEIN: 6.5 g/dL (ref 6.5–8.1)

## 2016-01-23 LAB — URINALYSIS, ROUTINE W REFLEX MICROSCOPIC
BILIRUBIN URINE: NEGATIVE
GLUCOSE, UA: NEGATIVE mg/dL
HGB URINE DIPSTICK: NEGATIVE
KETONES UR: NEGATIVE mg/dL
LEUKOCYTES UA: NEGATIVE
Nitrite: NEGATIVE
PH: 6 (ref 5.0–8.0)
PROTEIN: NEGATIVE mg/dL
Specific Gravity, Urine: 1.015 (ref 1.005–1.030)

## 2016-01-23 LAB — CBC WITH DIFFERENTIAL/PLATELET
Basophils Absolute: 0.1 10*3/uL (ref 0.0–0.1)
Basophils Relative: 1 %
Eosinophils Absolute: 0.1 10*3/uL (ref 0.0–0.7)
Eosinophils Relative: 2 %
HEMATOCRIT: 39.9 % (ref 39.0–52.0)
HEMOGLOBIN: 13.4 g/dL (ref 13.0–17.0)
LYMPHS ABS: 0.6 10*3/uL — AB (ref 0.7–4.0)
Lymphocytes Relative: 7 %
MCH: 29.3 pg (ref 26.0–34.0)
MCHC: 33.6 g/dL (ref 30.0–36.0)
MCV: 87.3 fL (ref 78.0–100.0)
MONO ABS: 1 10*3/uL (ref 0.1–1.0)
MONOS PCT: 12 %
NEUTROS ABS: 6.2 10*3/uL (ref 1.7–7.7)
NEUTROS PCT: 78 %
Platelets: 254 10*3/uL (ref 150–400)
RBC: 4.57 MIL/uL (ref 4.22–5.81)
RDW: 13.9 % (ref 11.5–15.5)
WBC: 7.9 10*3/uL (ref 4.0–10.5)

## 2016-01-23 LAB — LIPASE, BLOOD: LIPASE: 44 U/L (ref 11–51)

## 2016-01-23 LAB — PREGNANCY, URINE: Preg Test, Ur: NEGATIVE

## 2016-01-23 MED ORDER — IBUPROFEN 800 MG PO TABS
800.0000 mg | ORAL_TABLET | Freq: Once | ORAL | Status: AC
  Administered 2016-01-23: 800 mg via ORAL
  Filled 2016-01-23: qty 1

## 2016-01-23 MED ORDER — DEXTROSE 5 % IV SOLN
1.0000 g | Freq: Once | INTRAVENOUS | Status: AC
  Administered 2016-01-23: 1 g via INTRAVENOUS
  Filled 2016-01-23: qty 10

## 2016-01-23 MED ORDER — POTASSIUM CHLORIDE 10 MEQ/100ML IV SOLN
10.0000 meq | INTRAVENOUS | Status: AC
  Administered 2016-01-23 (×2): 10 meq via INTRAVENOUS
  Filled 2016-01-23 (×2): qty 100

## 2016-01-23 NOTE — ED Notes (Signed)
Consent signed by pt and supplies placed at bedside.

## 2016-01-23 NOTE — ED Notes (Signed)
Bed: [id number] Expected date:  Expected time:  Means of arrival:  Comments: Pancreatic cancer/abd pain

## 2016-01-23 NOTE — ED Provider Notes (Signed)
[facility] DEPT Provider Note   CSN: [id number] Arrival date & time: 01/23/16  1946  First Provider Contact: 8:30 PM   By signing my name below, I, Reola Mosher, attest that this documentation has been prepared under the direction and in the presence of [employer], PA-C.  Electronically Signed: Reola Mosher, ED Scribe. 01/23/16. 8:39 PM.  History   Chief Complaint Chief Complaint  Patient presents with  . Abdominal Pain   The history is provided by the patient. A language interpreter was used (friend translating at bedside).   HPI Comments: Santiel Hockey is a 45 y.o. male BIB EMS, with a PMHx of pancreatic cancer with metastization to the liver, spleen, and surrounding lymph nodes, Gastritis, GERD, and HTN, who presents to the Emergency Department complaining of gradual onset, constant, diffuse abdominal pain with associated loss of appetite x 2 weeks worsening over the past 3-4 days. Pt has associated nausea, vomiting x 5-6 episodes, intermittent cough, and intermittent headaches since the onset of this episode of abdominal pain. Pt also reports that his urine has been a red color "for a while", but denies seeing bright red blood in his urine. Per translator, he was admitted to the hospital and dx'd with pancreatic cancer ~2 weeks ago. He had a paracentesis to his abdomen to relieve his distension/abdominal pain during that admission, but both have since returned since d/c. He also notes that initally after being discharged that he was able to eat w/o difficulty, but for the past 3-4 days he has not been able to eat, but has been chewing gum. His emesis is exacerbated with eating food or drinking fluids. Pt has not had a BM since the onset of his loss of appetite. No PSHx to the abdomen. Denies fever, hematemesis, or breathing difficulties. No alleviating factors noted.   Past Medical History:  Diagnosis Date  . Daily headache   . Gastritis   . GERD  (gastroesophageal reflux disease)   . Hypertension   . Migraine    "alot; due to coughing" (12/28/2015)  . Pancreatic mass 12/28/2015  . Renal disorder    Pt reports Kidney problems intermittenlty. Unknown specifics    Patient Active Problem List   Diagnosis Date Noted  . SIRS (systemic inflammatory response syndrome) ([place]) 01/24/2016  . Right upper quadrant pain   . Palliative care encounter   . Goals of care, counseling/discussion   . DNR (do not resuscitate)   . Pancreatic mass 12/27/2015  . Ascites 12/27/2015  . Hemoptysis 08/18/2014  . HTN (hypertension) 08/04/2014  . Establishing care with new doctor, encounter for 08/04/2014  . Urinary tract infectious disease 08/04/2014    Past Surgical History:  Procedure Laterality Date  . NO PAST SURGERIES      Home Medications    Prior to Admission medications   Medication Sig Start Date End Date Taking? Authorizing Provider  feeding supplement, ENSURE ENLIVE, (ENSURE ENLIVE) LIQD Take 237 mLs by mouth 2 (two) times daily between meals. 12/29/15  Yes Florencia Reasons, MD  furosemide (LASIX) 40 MG tablet Take 1 tablet (40 mg total) by mouth daily. 12/29/15  Yes Florencia Reasons, MD  lansoprazole (PREVACID) 30 MG capsule Take 30 mg by mouth 2 (two) times daily before a meal.   Yes Historical Provider, MD  oxyCODONE-acetaminophen (PERCOCET/ROXICET) 5-325 MG tablet Take 1 tablet by mouth every 6 (six) hours. 12/29/15  Yes Florencia Reasons, MD  propranolol (INDERAL) 10 MG tablet Take 1 tablet (10 mg total) by mouth  3 (three) times daily. 12/29/15  Yes Florencia Reasons, MD  spironolactone (ALDACTONE) 100 MG tablet Take 1 tablet (100 mg total) by mouth daily. 12/29/15  Yes Florencia Reasons, MD    Family History Family History  Problem Relation Age of Onset  . Hypertension Mother     Social History Social History  Substance Use Topics  . Smoking status: Former Smoker    Packs/day: 0.50    Years: 6.00    Types: Cigarettes  . Smokeless tobacco: Never Used     Comment: "quit  smoking in ~ 1995  . Alcohol use 12.0 oz/week    20 Cans of beer per week     Comment: 12/28/2015 "3, 40oz beers, 2 days/week"   Allergies   Review of patient's allergies indicates no known allergies.   Review of Systems Review of Systems A complete 10 system review of systems was obtained and all systems are negative except as noted in the HPI and PMH.   Physical Exam Updated Vital Signs BP 120/76   Pulse 84   Temp 98.1 F (36.7 C) (Oral)   Resp 20   Ht 5\' 7"  (1.702 m)   Wt 62 kg   SpO2 98%   BMI 21.41 kg/m   Physical Exam  Constitutional: He is oriented to person, place, and time. He appears well-developed and well-nourished. No distress.  Nontoxic appearing, in no distress  HENT:  Head: Normocephalic and atraumatic.  Eyes: Conjunctivae and EOM are normal. No scleral icterus.  Neck: Normal range of motion.  Cardiovascular: Regular rhythm and intact distal pulses.   Tachycardia  Pulmonary/Chest: Effort normal. No respiratory distress. He has no wheezes. He has no rales.  Lungs CTAB  Abdominal: He exhibits distension and ascites. There is generalized tenderness.  Distended abdomen with generalized TTP. No rigidity or involuntary guarding.  Musculoskeletal: Normal range of motion.  Neurological: He is alert and oriented to person, place, and time.  Skin: Skin is warm and dry. No rash noted. He is not diaphoretic. No erythema. No pallor.  Psychiatric: He has a normal mood and affect. His behavior is normal.  Nursing note and vitals reviewed.    ED Treatments / Results   DIAGNOSTIC STUDIES: Oxygen Saturation is 95% on RA, adequate by my interpretation.   COORDINATION OF CARE: 8:35 PM-Discussed next steps with pt including CBC, Lipase, CMP, XR Abd, and UA. Pt verbalized understanding and is agreeable with the plan.   Labs (all labs ordered are listed, but only abnormal results are displayed) Labs Reviewed  CBC WITH DIFFERENTIAL/PLATELET - Abnormal; Notable for  the following:       Result Value   Lymphs Abs 0.6 (*)    All other components within normal limits  COMPREHENSIVE METABOLIC PANEL - Abnormal; Notable for the following:    Sodium 129 (*)    Potassium 2.7 (*)    Chloride 97 (*)    Glucose, Bld 112 (*)    Calcium 7.8 (*)    Albumin 2.5 (*)    AST 164 (*)    ALT 66 (*)    Alkaline Phosphatase 198 (*)    Total Bilirubin 1.5 (*)    All other components within normal limits  URINALYSIS, ROUTINE W REFLEX MICROSCOPIC (NOT AT [facility]) - Abnormal; Notable for the following:    Color, Urine AMBER (*)    All other components within normal limits  CULTURE, BLOOD (ROUTINE X 2)  CULTURE, BLOOD (ROUTINE X 2)  URINE CULTURE  BODY FLUID CULTURE  GRAM STAIN  LIPASE, BLOOD  PREGNANCY, URINE  LACTATE DEHYDROGENASE, BODY FLUID  GLUCOSE, PERITONEAL FLUID  PROTEIN, BODY FLUID  ALBUMIN, FLUID  BODY FLUID CELL COUNT WITH DIFFERENTIAL    EKG  EKG Interpretation None       Radiology Dg Abd 2 Views  Result Date: 01/23/2016 CLINICAL DATA:  Nausea vomiting and generalized abdominal pain. EXAM: ABDOMEN - 2 VIEW COMPARISON:  CT of the abdomen and pelvis 12/27/2015 FINDINGS: The bowel gas pattern is nonobstructive, with clustering of small bowel loops in the center of the abdomen, consistent with presence of moderate amount of ascites. There is relative paucity of gas in the colon. No evidence of organomegaly. IMPRESSION: Nonobstructive bowel gas pattern. Moderate amount of abdominal ascites. Electronically Signed   By: Fidela Salisbury M.D.   On: 01/23/2016 21:26   Procedures .Paracentesis Date/Time: 01/24/2016 3:31 AM Performed by: Antonietta Breach Authorized by: Antonietta Breach   Consent:    Consent obtained:  Verbal and written   Consent given by:  Patient   Risks discussed:  Bleeding, bowel perforation, infection and pain Pre-procedure details:    Procedure purpose:  Diagnostic   Preparation: Patient was prepped and draped in usual sterile  fashion   Anesthesia (see MAR for exact dosages):    Anesthesia method:  Local infiltration   Local anesthetic:  Lidocaine 1% w/o epi Procedure details:    Ultrasound guidance: yes     Puncture site:  R lower quadrant   Fluid removed amount:  5,000cc   Fluid appearance:  Amber   Dressing:  Adhesive bandage Post-procedure details:    Patient tolerance of procedure:  Tolerated well, no immediate complications Comments:     Diagnostic and therapeutic paracentesis in patient with hx of metastatic, aggressive pancreatic cancer. Hx of ascites with therapeutic paracentesis during prior admission with 1.2L drained. Just over 5,000cc drained today with no immediate complications. Patient tolerated procedure well.     Medications Ordered in ED Medications  feeding supplement (ENSURE ENLIVE) (ENSURE ENLIVE) liquid 237 mL (not administered)  pantoprazole (PROTONIX) EC tablet 20 mg (not administered)  oxyCODONE-acetaminophen (PERCOCET/ROXICET) 5-325 MG per tablet 1 tablet (1 tablet Oral Given 01/24/16 0527)  propranolol (INDERAL) tablet 10 mg (not administered)  ondansetron (ZOFRAN) tablet 4 mg (not administered)    Or  ondansetron (ZOFRAN) injection 4 mg (not administered)  sodium chloride flush (NS) 0.9 % injection 3 mL (not administered)  sodium chloride flush (NS) 0.9 % injection 3 mL (not administered)  cefTRIAXone (ROCEPHIN) 1 g in dextrose 5 % 50 mL IVPB (0 g Intravenous Stopped 01/23/16 2229)  ibuprofen (ADVIL,MOTRIN) tablet 800 mg (800 mg Oral Given 01/23/16 2159)  potassium chloride 10 mEq in 100 mL IVPB (0 mEq Intravenous Stopped 01/24/16 0045)  albumin human 25 % solution 12.5 g (0 g Intravenous Stopped 01/24/16 0221)  morphine 4 MG/ML injection 4 mg (4 mg Intravenous Given 01/24/16 0230)  ondansetron (ZOFRAN) injection 4 mg (4 mg Intravenous Given 01/24/16 0229)  diphenhydrAMINE (BENADRYL) capsule 50 mg (50 mg Oral Given 01/24/16 0438)  potassium chloride SA (K-DUR,KLOR-CON) CR tablet 60  mEq (60 mEq Oral Given 01/24/16 0526)    CRITICAL CARE Performed by: Antonietta Breach   Total critical care time: 35 minutes  Critical care time was exclusive of separately billable procedures and treating other patients.  Critical care was necessary to treat or prevent imminent or life-threatening deterioration.  Critical care was time spent personally by me on the following activities: development of treatment plan with  patient and/or surrogate as well as nursing, discussions with consultants, evaluation of patient's response to treatment, examination of patient, obtaining history from patient or surrogate, ordering and performing treatments and interventions, ordering and review of laboratory studies, ordering and review of radiographic studies, pulse oximetry and re-evaluation of patient's condition.   Initial Impression / Assessment and Plan / ED Course  I have reviewed the triage vital signs and the nursing notes.  Pertinent labs & imaging results that were available during my care of the patient were reviewed by me and considered in my medical decision making (see chart for details).  Clinical Course   Patient presenting for worsening abdominal pain and distension accompanied by anorexia, N/V. Patient with recent diagnosis of metastatic pancreatic cancer. He is a DNR and has declined all treatments for his cancer as he wishes to travel back home to [place] to be with his family given his poor prognosis. [place].  He recently was admitted for similar symptoms, at which time his diagnosis was discovered. 1.2L of peritoneal fluid was drained during this admission. Patient with obvious ascites and no evidence of obstruction on Xray. Elevated LFTs today likely from worsening liver mets. Hypokalemia treated with IV potassium.  Patient mildly febrile on arrival to 100.30F. He was tachycardic as well, raising concern for SBP. No leukocytosis today. A,  both, diagnostic and therapeutic paracentesis was performed, yielding >5L of dark amber peritoneal fluid. Patient tolerated well. He was treated with IV Rocephin and albumin.  Given concern for SBP with SIRS criteria on arrival, plan to admit to [facility] pending peritoneal culture reports. Dr. Hal Hope to admit.   Final Clinical Impressions(s) / ED Diagnoses   Final diagnoses:  Nausea and vomiting  Abdominal pain  Ascites  SIRS (systemic inflammatory response syndrome) ([place])    New Prescriptions Current Discharge Medication List     I personally performed the services described in this documentation, which was scribed in my presence. The recorded information has been reviewed and is accurate.       Antonietta Breach, PA-C 01/24/16 [id number]    Harvel Quale, MD 01/25/16 1515

## 2016-01-23 NOTE — ED Triage Notes (Signed)
Per EMS, pt is coming from home with complaints of N/V and generalized abdominal pain with decreased appetite. Per EMS pt abdomen is distended. Pt has reported not having any recent bowel movements and increased urine output red in color.

## 2016-01-24 ENCOUNTER — Encounter (HOSPITAL_COMMUNITY): Payer: Self-pay | Admitting: Emergency Medicine

## 2016-01-24 DIAGNOSIS — N390 Urinary tract infection, site not specified: Secondary | ICD-10-CM

## 2016-01-24 DIAGNOSIS — R188 Other ascites: Secondary | ICD-10-CM | POA: Diagnosis not present

## 2016-01-24 DIAGNOSIS — R1084 Generalized abdominal pain: Secondary | ICD-10-CM

## 2016-01-24 DIAGNOSIS — R6510 Systemic inflammatory response syndrome (SIRS) of non-infectious origin without acute organ dysfunction: Secondary | ICD-10-CM | POA: Insufficient documentation

## 2016-01-24 DIAGNOSIS — R109 Unspecified abdominal pain: Secondary | ICD-10-CM

## 2016-01-24 DIAGNOSIS — K869 Disease of pancreas, unspecified: Secondary | ICD-10-CM

## 2016-01-24 LAB — BODY FLUID CELL COUNT WITH DIFFERENTIAL
LYMPHS FL: 68 %
MONOCYTE-MACROPHAGE-SEROUS FLUID: 12 % — AB (ref 50–90)
Neutrophil Count, Fluid: 20 % (ref 0–25)
Total Nucleated Cell Count, Fluid: 799 cu mm (ref 0–1000)

## 2016-01-24 LAB — CBC WITH DIFFERENTIAL/PLATELET
Basophils Absolute: 0.1 10*3/uL (ref 0.0–0.1)
Basophils Relative: 1 %
EOS PCT: 3 %
Eosinophils Absolute: 0.1 10*3/uL (ref 0.0–0.7)
HEMATOCRIT: 40.8 % (ref 39.0–52.0)
Hemoglobin: 13.7 g/dL (ref 13.0–17.0)
LYMPHS ABS: 0.4 10*3/uL — AB (ref 0.7–4.0)
LYMPHS PCT: 10 %
MCH: 29.1 pg (ref 26.0–34.0)
MCHC: 33.6 g/dL (ref 30.0–36.0)
MCV: 86.6 fL (ref 78.0–100.0)
MONO ABS: 0.3 10*3/uL (ref 0.1–1.0)
Monocytes Relative: 7 %
Neutro Abs: 3.3 10*3/uL (ref 1.7–7.7)
Neutrophils Relative %: 79 %
PLATELETS: 193 10*3/uL (ref 150–400)
RBC: 4.71 MIL/uL (ref 4.22–5.81)
RDW: 14.1 % (ref 11.5–15.5)
WBC: 4.2 10*3/uL (ref 4.0–10.5)

## 2016-01-24 LAB — COMPREHENSIVE METABOLIC PANEL
ALBUMIN: 2.5 g/dL — AB (ref 3.5–5.0)
ALT: 53 U/L (ref 17–63)
AST: 126 U/L — AB (ref 15–41)
Alkaline Phosphatase: 154 U/L — ABNORMAL HIGH (ref 38–126)
Anion gap: 8 (ref 5–15)
BUN: 8 mg/dL (ref 6–20)
CHLORIDE: 101 mmol/L (ref 101–111)
CO2: 25 mmol/L (ref 22–32)
Calcium: 7.8 mg/dL — ABNORMAL LOW (ref 8.9–10.3)
Creatinine, Ser: 0.81 mg/dL (ref 0.61–1.24)
GFR calc Af Amer: >60 mL/min (ref >60)
GFR calc non Af Amer: >60 mL/min (ref >60)
GLUCOSE: 148 mg/dL — AB (ref 65–99)
POTASSIUM: 3.2 mmol/L — AB (ref 3.5–5.1)
SODIUM: 134 mmol/L — AB (ref 135–145)
Total Bilirubin: 1.4 mg/dL — ABNORMAL HIGH (ref 0.3–1.2)
Total Protein: 6 g/dL — ABNORMAL LOW (ref 6.5–8.1)

## 2016-01-24 LAB — ALBUMIN, FLUID (OTHER): ALBUMIN FL: 1.6 g/dL

## 2016-01-24 LAB — LACTATE DEHYDROGENASE, PLEURAL OR PERITONEAL FLUID: LD, Fluid: 202 U/L — ABNORMAL HIGH (ref 3–23)

## 2016-01-24 LAB — GRAM STAIN

## 2016-01-24 LAB — PROTEIN, BODY FLUID: Total protein, fluid: 3.6 g/dL

## 2016-01-24 LAB — MAGNESIUM: Magnesium: 2.1 mg/dL (ref 1.7–2.4)

## 2016-01-24 LAB — PATHOLOGIST SMEAR REVIEW

## 2016-01-24 LAB — GLUCOSE, PERITONEAL FLUID: GLUCOSE, PERITONEAL FLUID: 75 mg/dL

## 2016-01-24 MED ORDER — SODIUM CHLORIDE 0.9% FLUSH
3.0000 mL | Freq: Two times a day (BID) | INTRAVENOUS | Status: DC
  Administered 2016-01-24: 3 mL via INTRAVENOUS

## 2016-01-24 MED ORDER — MORPHINE SULFATE (PF) 4 MG/ML IV SOLN
4.0000 mg | Freq: Once | INTRAVENOUS | Status: AC
  Administered 2016-01-24: 4 mg via INTRAVENOUS
  Filled 2016-01-24: qty 1

## 2016-01-24 MED ORDER — POTASSIUM CHLORIDE 20 MEQ PO PACK: 40.0000 meq | PACK | Freq: Once | ORAL | Status: DC

## 2016-01-24 MED ORDER — PROPRANOLOL HCL 10 MG PO TABS
10.0000 mg | ORAL_TABLET | Freq: Three times a day (TID) | ORAL | Status: DC
  Administered 2016-01-24: 10 mg via ORAL
  Filled 2016-01-24: qty 1

## 2016-01-24 MED ORDER — POTASSIUM CHLORIDE CRYS ER 20 MEQ PO TBCR
40.0000 meq | EXTENDED_RELEASE_TABLET | Freq: Once | ORAL | Status: AC
  Administered 2016-01-24: 40 meq via ORAL
  Filled 2016-01-24: qty 2

## 2016-01-24 MED ORDER — ONDANSETRON HCL 4 MG/2ML IJ SOLN: 4.0000 mg | Freq: Four times a day (QID) | INTRAMUSCULAR | Status: DC | PRN

## 2016-01-24 MED ORDER — OXYCODONE-ACETAMINOPHEN 5-325 MG PO TABS
1.0000 | ORAL_TABLET | Freq: Four times a day (QID) | ORAL | Status: DC
  Administered 2016-01-24 (×2): 1 via ORAL
  Filled 2016-01-24 (×2): qty 1

## 2016-01-24 MED ORDER — PANTOPRAZOLE SODIUM 20 MG PO TBEC
20.0000 mg | DELAYED_RELEASE_TABLET | Freq: Every day | ORAL | Status: DC
  Administered 2016-01-24: 20 mg via ORAL
  Filled 2016-01-24: qty 1

## 2016-01-24 MED ORDER — ONDANSETRON HCL 4 MG/2ML IJ SOLN
4.0000 mg | Freq: Once | INTRAMUSCULAR | Status: AC
  Administered 2016-01-24: 4 mg via INTRAVENOUS
  Filled 2016-01-24: qty 2

## 2016-01-24 MED ORDER — CEFTRIAXONE SODIUM 1 G IJ SOLR: 1.0000 g | INTRAMUSCULAR | Status: DC

## 2016-01-24 MED ORDER — DEXTROSE 5 % IV SOLN
2.0000 g | INTRAVENOUS | Status: DC
  Filled 2016-01-24: qty 2

## 2016-01-24 MED ORDER — POTASSIUM CHLORIDE ER 10 MEQ PO TBCR: 10.0000 meq | EXTENDED_RELEASE_TABLET | Freq: Every day | ORAL | 0 refills | Status: AC

## 2016-01-24 MED ORDER — POTASSIUM CHLORIDE CRYS ER 20 MEQ PO TBCR
60.0000 meq | EXTENDED_RELEASE_TABLET | Freq: Once | ORAL | Status: AC
  Administered 2016-01-24: 60 meq via ORAL
  Filled 2016-01-24: qty 3

## 2016-01-24 MED ORDER — ALBUMIN HUMAN 25 % IV SOLN
12.5000 g | Freq: Once | INTRAVENOUS | Status: AC
  Administered 2016-01-24: 12.5 g via INTRAVENOUS
  Filled 2016-01-24: qty 50

## 2016-01-24 MED ORDER — DIPHENHYDRAMINE HCL 50 MG PO CAPS
50.0000 mg | ORAL_CAPSULE | Freq: Once | ORAL | Status: AC
  Administered 2016-01-24: 50 mg via ORAL
  Filled 2016-01-24: qty 1

## 2016-01-24 MED ORDER — SODIUM CHLORIDE 0.9% FLUSH: 3.0000 mL | Freq: Two times a day (BID) | INTRAVENOUS | Status: DC

## 2016-01-24 MED ORDER — CETYLPYRIDINIUM CHLORIDE 0.05 % MT LIQD
7.0000 mL | Freq: Two times a day (BID) | OROMUCOSAL | Status: DC
  Administered 2016-01-24: 7 mL via OROMUCOSAL

## 2016-01-24 MED ORDER — ENSURE ENLIVE PO LIQD
237.0000 mL | Freq: Two times a day (BID) | ORAL | Status: DC
  Administered 2016-01-24: 237 mL via ORAL

## 2016-01-24 MED ORDER — CIPROFLOXACIN HCL 500 MG PO TABS: 500.0000 mg | ORAL_TABLET | Freq: Two times a day (BID) | ORAL | 0 refills | Status: AC

## 2016-01-24 MED ORDER — ONDANSETRON HCL 4 MG PO TABS: 4.0000 mg | ORAL_TABLET | Freq: Four times a day (QID) | ORAL | Status: DC | PRN

## 2016-01-24 NOTE — Discharge Summary (Signed)
Physician Discharge Summary  Keith Hart Keith Hart ZP:[id number] DOB: 05/06/1971 DOA: 01/23/2016  PCP: No PCP Per Patient  Admit date: 01/23/2016 Discharge date: 01/24/2016  Time spent: Greater than 30 minutes  Recommendations for Outpatient Follow-up:  1. Discharge patient home today. 2. Follow up with PCP within one week.    Discharge Diagnoses:  Principal Problem:   Ascites Active Problems:   Urinary tract infectious disease   Pancreatic mass   Abdominal pain   Discharge Condition: Optimized.  Diet recommendation: Cardiac diet.  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 Patient presented with increasing abdominal distention and discomfort over the last 1 week. Patient was admitted last month and had paracentesis done at that time. Acute abdominal series done in the ER showed moderate ascites. Patient had 5 L of fluid removed by paracentesis in the ER. Albumin was given. Since patient was mildly febrile blood cultures and labs for the ascitic fluids were sent and started on empiric antibiotics. Patient was also on diuretics and was found to be hypokalemic. Over the last 1 week preceding admission, patient developed benign persistent nausea and vomiting.    Hospital Course: Patient was placed on observation. Patient was pan-cultured. Patient was started on empiric antibiotics. Patient has remained stable and wants to be discharged home. Patient will be discharged home on oral antibiotics.  Procedures:  None.  Consultations:  None.  Discharge Exam: Vitals:   01/24/16 0325 01/24/16 0543  BP: 115/83 128/83  Pulse: 85 90  Resp: 19 19  Temp: 97.5 F (36.4 C) 98.1  F (36.7 C)    General: Not in distress. Cardiovascular: S1S2. Respiratory: Clear to auscultation.  Discharge Instructions   Discharge Instructions    Diet - low sodium heart healthy    Complete by:  As directed   Discharge instructions    Complete by:  As directed   Follow up with PCP within one week. Check BMP in 2 days at the PCP's office   Increase activity slowly    Complete by:  As directed     Current Discharge Medication List    START taking these medications   Details  ciprofloxacin (CIPRO) 500 MG tablet Take 1 tablet (500 mg total) by mouth 2 (two) times daily. Qty: 10 tablet, Refills: 0    potassium chloride (K-DUR) 10 MEQ tablet Take 1 tablet (10 mEq total) by mouth daily. Qty: 30 tablet, Refills: 0      CONTINUE these medications which have NOT CHANGED   Details  feeding supplement, ENSURE ENLIVE, (ENSURE ENLIVE) LIQD Take 237 mLs by mouth 2 (two) times daily between meals. Qty: 237 mL, Refills: 1    furosemide (LASIX) 40 MG tablet Take 1 tablet (40 mg total) by mouth daily. Qty: 60 tablet, Refills: 0    lansoprazole (PREVACID) 30 MG capsule Take 30 mg by mouth 2 (two) times daily before a meal.    oxyCODONE-acetaminophen (PERCOCET/ROXICET) 5-325 MG tablet Take 1 tablet by mouth every 6 (six) hours. Qty: 10 tablet, Refills: 0    propranolol (INDERAL) 10 MG tablet Take 1 tablet (10 mg total) by mouth 3 (three) times daily. Qty: 90 tablet,  Refills: 0    spironolactone (ALDACTONE) 100 MG tablet Take 1 tablet (100 mg total) by mouth daily. Qty: 60 tablet, Refills: 0       Allergies  Allergen Reactions  . Other Other (See Comments)    Patient does not eat [place] per religious preference and it will make patient sick.  . Pork-Derived Products Other (See Comments)    Per religious preference and will make patient sick.      The results of significant diagnostics from this hospitalization (including imaging, microbiology, ancillary and laboratory) are  listed below for reference.    Significant Diagnostic Studies: Dg Chest 2 View  Result Date: 12/27/2015 CLINICAL DATA:  Cough for 1 month EXAM: CHEST  2 VIEW COMPARISON:  12/21/2015 FINDINGS: The heart size and mediastinal contours are within normal limits. Both lungs are clear. The visualized skeletal structures are unremarkable. IMPRESSION: No active cardiopulmonary disease. Electronically Signed   By: Andreas Newport M.D.   On: 12/27/2015 21:30   US Abdomen Complete  Result Date: 12/26/2015 CLINICAL DATA:  Six weeks of abdominal distension, elevated liver function studies EXAM: ABDOMEN ULTRASOUND COMPLETE COMPARISON:  Abdominal and pelvic CT scan of January 29, 2015 FINDINGS: Gallbladder: No gallstones or wall thickening visualized. No sonographic Murphy sign noted by sonographer. Common bile duct: Diameter: 3.6 mm Liver: The hepatic echotexture is mildly increased. There is no focal mass nor ductal dilation. There is a moderate amount of ascites however. The surface contour of the liver is slightly irregular. IVC: No abnormality visualized. Pancreas: The pancreatic body is grossly normal. There are masslike within or adjacent to the pancreatic head with the largest measuring 2.3 cm in greatest dimension. The pancreatic tail is not well visualized. Spleen: Multiple masses lie within the splenic hilum and appear to invade the spleen. The spleen itself measures 9.5 cm in length. Right Kidney: Length: 10.5 cm. Echogenicity within normal limits. No mass or hydronephrosis visualized. Left Kidney: Length: 10.5 cm. Echogenicity within normal limits. No mass or hydronephrosis visualized. Abdominal aorta: No aneurysm visualized. Other findings: There is ascites. IMPRESSION: 1. Abnormal masses associated with pancreatic head and with the spleen and splenic hilum. This may reflect bulky lymphadenopathy. The findings associated with the spleen are somewhat tortuous and could reflect thrombosed vessels. 2. Mildly  increased hepatic echotexture, slight surface contour irregularity and considerable ascites worrisome for hepatocellular dysfunction either due to primary or metastatic malignancy or to hepatitis. Abdominal and pelvic contrast-enhanced CT scanning is recommended. Electronically Signed   By: David  Martinique M.D.   On: 12/26/2015 09:40   Ct Abdomen Pelvis W Contrast  Result Date: 12/27/2015 CLINICAL DATA:  Abdominal pain with abnormal ultrasound EXAM: CT ABDOMEN AND PELVIS WITH CONTRAST TECHNIQUE: Multidetector CT imaging of the abdomen and pelvis was performed using the standard protocol following bolus administration of intravenous contrast. CONTRAST:  177mL ISOVUE-300 IOPAMIDOL (ISOVUE-300) INJECTION 61% COMPARISON:  12/26/2015, CT from 01/29/2015 FINDINGS: Lung bases are free of acute infiltrate or sizable effusion. A small 4 mm nodule is again noted in the left lower lobe and stable from the prior CT examination. The liver is homogeneous in attenuation. The gallbladder is partially decompressed. The adrenal glands and kidneys are within normal limits. The pancreas is well visualized and there is a hypodense lesion adjacent to the head of the pancreas best seen on image number 30 of series 2. This demonstrates central decreased attenuation which may represent some necrosis. It measures approximately 2.4 cm. This lies immediately adjacent to the head of  the pancreas and may be exophytic from the head of the pancreas. This was not present on the prior exam. In the tail of the pancreas there is a multi focal mass lesion which measures approximately 7 by 6.5 cm with similar appearance to that noted in the region of the head of the pancreas. There is also some local invasion into the spleen. These changes have also increased significantly in the interval from the prior exam. The spleen is otherwise within normal limits. Multiple venous collaterals are noted in the left upper abdomen may which may be related to  underlying splenic vein narrowing or occlusion by the mass lesion in the pancreas. Significant ascites is noted. Scattered lymph nodes are noted in the periaortic region particularly on image number 37 of series 2 which measures approximately 15 mm in short axis. Portacaval lymph nodes are also seen as well as lymphadenopathy in the region of the gastrohepatic ligament and celiac axis. Bony structures are within normal limits. IMPRESSION: Changes consistent with a mass in the tail of the pancreas with evidence of in growth into the hilum of the spleen with ascites and significant lymphadenopathy as described. This is consistent with a pancreatic malignancy. There are also noted changes suggestive of splenic vein narrowing or occlusion. Electronically Signed   By: Inez Catalina M.D.   On: 12/27/2015 21:24   US Paracentesis  Result Date: 12/28/2015 INDICATION: Pancreatic mass. Ascites. Request for diagnostic and therapeutic paracentesis. EXAM: ULTRASOUND GUIDED RIGHT LATERAL ABDOMEN PARACENTESIS MEDICATIONS: 1% Lidocaine. COMPLICATIONS: None immediate. PROCEDURE: Informed written consent was obtained from the patient after a discussion of the risks, benefits and alternatives to treatment. A timeout was performed prior to the initiation of the procedure. Initial ultrasound scanning demonstrates a moderate amount of ascites within the right lower abdominal quadrant. The right lower abdomen was prepped and draped in the usual sterile fashion. 1% lidocaine with epinephrine was used for local anesthesia. Following this, a 19 gauge, 7-cm, Yueh catheter was introduced. An ultrasound image was saved for documentation purposes. The paracentesis was performed. The catheter was removed and a dressing was applied. The patient tolerated the procedure well without immediate post procedural complication. FINDINGS: A total of approximately 11.2 liters of clear yellow fluid was removed. Samples were sent to the laboratory as  requested by the clinical team. IMPRESSION: Successful ultrasound-guided paracentesis yielding 1.2 liters of peritoneal fluid. Read by:  Gareth Eagle, PA-C Electronically Signed   By: Jacqulynn Cadet M.D.   On: 12/28/2015 08:57   Dg Abd 2 Views  Result Date: 01/23/2016 CLINICAL DATA:  Nausea vomiting and generalized abdominal pain. EXAM: ABDOMEN - 2 VIEW COMPARISON:  CT of the abdomen and pelvis 12/27/2015 FINDINGS: The bowel gas pattern is nonobstructive, with clustering of small bowel loops in the center of the abdomen, consistent with presence of moderate amount of ascites. There is relative paucity of gas in the colon. No evidence of organomegaly. IMPRESSION: Nonobstructive bowel gas pattern. Moderate amount of abdominal ascites. Electronically Signed   By: Fidela Salisbury M.D.   On: 01/23/2016 21:26   Microbiology: No results found for this or any previous visit (from the past 240 hour(s)).   Labs: Basic Metabolic Panel:  Recent Labs Lab 01/23/16 2049 01/24/16 0603 01/24/16 0607  NA 129*  --  134*  K 2.7*  --  3.2*  CL 97*  --  101  CO2 23  --  25  GLUCOSE 112*  --  148*  BUN 9  --  8  CREATININE 0.84  --  0.81  CALCIUM 7.8*  --  7.8*  MG  --  2.1  --    Liver Function Tests:  Recent Labs Lab 01/23/16 2049 01/24/16 0607  AST 164* 126*  ALT 66* 53  ALKPHOS 198* 154*  BILITOT 1.5* 1.4*  PROT 6.5 6.0*  ALBUMIN 2.5* 2.5*    Recent Labs Lab 01/23/16 2049  LIPASE 44   No results for input(s): AMMONIA in the last 168 hours. CBC:  Recent Labs Lab 01/23/16 2049 01/24/16 0607  WBC 7.9 4.2  NEUTROABS 6.2 3.3  HGB 13.4 13.7  HCT 39.9 40.8  MCV 87.3 86.6  PLT 254 193   Cardiac Enzymes: No results for input(s): CKTOTAL, CKMB, CKMBINDEX, TROPONINI in the last 168 hours. BNP: BNP (last 3 results) No results for input(s): BNP in the last 8760 hours.  ProBNP (last 3 results) No results for input(s): PROBNP in the last 8760 hours.  CBG: No results for  input(s): GLUCAP in the last 168 hours.     Signed:  Dana Allan, MD  [facility] Pager #: [phone] 7PM-7AM contact night coverage as above

## 2016-01-24 NOTE — Progress Notes (Signed)
Pharmacy Antibiotic Note - Brief Note  Keith Hart is a 45 y.o. male admitted on 01/23/2016 with SBP (not UTI as indicated by physician in consult to pharmacy).  Pharmacy has been consulted for Ceftriaxone dosing.  Plan:  Based on indication of rule out SBP, increase ceftriaxone to 2gm IV q24h  No renal dose adjustment needed, pharmacy to sign-off  Await cultures and stop/narrow antibiotics as appropriate  Doreene Eland, PharmD, BCPS.   Pager: [phone] 01/24/2016 8:28 AM

## 2016-01-24 NOTE — H&P (Addendum)
History and Physical    [name] [id number] DOB: [birth year] DOA: 01/23/2016  PCP: No PCP Per Patient  Patient coming from: Home.  Patient's friend provided translation. Patient speaks [place] language.  Chief Complaint: Abdominal distention and discomfort.  HPI: Keith Hart is a 45 y.o. 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 Patient was admitted last month and had paracentesis done at that time. Acute abdominal series done in the ER showed moderate ascites. Patient had 5 L of fluid removed by paracentesis in the ER. Albumin was given. Since patient is mildly febrile blood cultures and labs for the ascitic fluids were sent and started on empiric antibiotics. Patient is also on diuretics and is found to be hypokalemic. Patient on my exam states he feels much better after the paracentesis. Denies any chest pain or shortness of breath. Does not want any active intervention other than paracentesis. Over the last 1 week patient also has benign persistent nausea vomiting. And has been only eating fruits.  ED Course: See history of present illness. Patient is started on ceftriaxone for possible SBP.  Review of Systems: As per HPI, rest all negative.   Past Medical History:  Diagnosis Date  . Daily headache   . Gastritis   . GERD (gastroesophageal reflux disease)   . Hypertension   . Migraine    "alot; due to coughing" (12/28/2015)  . Pancreatic mass 12/28/2015  . Renal disorder    Pt reports Kidney problems intermittenlty. Unknown specifics    Past Surgical History:  Procedure Laterality Date  . NO PAST SURGERIES       reports that he has quit smoking. His smoking use included Cigarettes. He has a 3.00 pack-year  smoking history. He has never used smokeless tobacco. He reports that he drinks about 12.0 oz of alcohol per week . He reports that he does not use drugs.  Allergies  Allergen Reactions  . Other Other (See Comments)    Patient does not eat [place] per religious preference and it will make patient sick.  . Pork-Derived Products Other (See Comments)    Per religious preference and will make patient sick.    Family History  Problem Relation Age of Onset  . Hypertension Mother     Prior to Admission medications   Medication Sig Start Date End Date Taking? Authorizing Provider  feeding supplement, ENSURE ENLIVE, (ENSURE ENLIVE) LIQD Take 237 mLs by mouth 2 (two) times daily between meals. 12/29/15  Yes Florencia Reasons, MD  furosemide (LASIX) 40 MG tablet Take 1 tablet (40 mg total) by mouth daily. 12/29/15  Yes Florencia Reasons, MD  lansoprazole (PREVACID) 30 MG capsule Take 30 mg by mouth 2 (two) times daily before a meal.   Yes Historical Provider, MD  oxyCODONE-acetaminophen (PERCOCET/ROXICET) 5-325 MG tablet Take 1 tablet by mouth every 6 (six) hours. 12/29/15  Yes Florencia Reasons, MD  propranolol (INDERAL) 10 MG tablet Take 1 tablet (10 mg total) by mouth 3 (three) times daily. 12/29/15  Yes Florencia Reasons, MD  spironolactone (ALDACTONE) 100 MG tablet Take 1 tablet (100 mg total) by mouth daily. 12/29/15  Yes Florencia Reasons, MD    Physical Exam: Vitals:   01/24/16 0130 01/24/16 0221 01/24/16 0300 01/24/16 0325  BP: 119/76  120/76  115/83  Pulse: 87  84 85  Resp: (!) 27  20 19   Temp:    97.5 F (36.4 C)  TempSrc:    Oral  SpO2: 99%  98% 100%  Weight:  136 lb 11 oz (62 kg)  139 lb 15.9 oz (63.5 kg)  Height:  5\' 7"  (1.702 m)  5\' 6"  (1.676 m)      Constitutional: Not in distress. Vitals:   01/24/16 0130 01/24/16 0221 01/24/16 0300 01/24/16 0325  BP: 119/76  120/76 115/83  Pulse: 87  84 85  Resp: (!) 27  20 19   Temp:    97.5 F (36.4 C)  TempSrc:    Oral  SpO2: 99%  98% 100%  Weight:  136 lb 11 oz (62 kg)  139 lb  15.9 oz (63.5 kg)  Height:  5\' 7"  (1.702 m)  5\' 6"  (1.676 m)   Eyes: Anicteric mild pallor. ENMT: No discharge from the ears eyes nose and mouth. Neck: No mass felt. No neck rigidity. Respiratory: No rhonchi or crepitations. Cardiovascular: S1 and S2 heard. Abdomen: Mildly distended nontender bowel sounds present. Musculoskeletal: No edema. Skin: No rash. Neurologic: Alert awake oriented to time place and person. Moves all extremities. Psychiatric: Appears normal.   Labs on Admission: I have personally reviewed following labs and imaging studies  CBC:  Recent Labs Lab 01/23/16 2049  WBC 7.9  NEUTROABS 6.2  HGB 13.4  HCT 39.9  MCV 87.3  PLT 0000000   Basic Metabolic Panel:  Recent Labs Lab 01/23/16 2049  NA 129*  K 2.7*  CL 97*  CO2 23  GLUCOSE 112*  BUN 9  CREATININE 0.84  CALCIUM 7.8*   GFR: Estimated Creatinine Clearance: 99.7 mL/min (by C-G formula based on SCr of 0.84 mg/dL). Liver Function Tests:  Recent Labs Lab 01/23/16 2049  AST 164*  ALT 66*  ALKPHOS 198*  BILITOT 1.5*  PROT 6.5  ALBUMIN 2.5*    Recent Labs Lab 01/23/16 2049  LIPASE 44   No results for input(s): AMMONIA in the last 168 hours. Coagulation Profile: No results for input(s): INR, PROTIME in the last 168 hours. Cardiac Enzymes: No results for input(s): CKTOTAL, CKMB, CKMBINDEX, TROPONINI in the last 168 hours. BNP (last 3 results) No results for input(s): PROBNP in the last 8760 hours. HbA1C: No results for input(s): HGBA1C in the last 72 hours. CBG: No results for input(s): GLUCAP in the last 168 hours. Lipid Profile: No results for input(s): CHOL, HDL, LDLCALC, TRIG, CHOLHDL, LDLDIRECT in the last 72 hours. Thyroid Function Tests: No results for input(s): TSH, T4TOTAL, FREET4, T3FREE, THYROIDAB in the last 72 hours. Anemia Panel: No results for input(s): VITAMINB12, FOLATE, FERRITIN, TIBC, IRON, RETICCTPCT in the last 72 hours. Urine analysis:    Component Value  Date/Time   COLORURINE AMBER (A) 01/23/2016 2045   APPEARANCEUR CLEAR 01/23/2016 2045   LABSPEC 1.015 01/23/2016 2045   PHURINE 6.0 01/23/2016 2045   GLUCOSEU NEGATIVE 01/23/2016 2045   HGBUR NEGATIVE 01/23/2016 2045   BILIRUBINUR NEGATIVE 01/23/2016 2045   BILIRUBINUR neg 08/04/2014 0956   KETONESUR NEGATIVE 01/23/2016 2045   PROTEINUR NEGATIVE 01/23/2016 2045   UROBILINOGEN 1.0 01/29/2015 1240   NITRITE NEGATIVE 01/23/2016 2045   LEUKOCYTESUR NEGATIVE 01/23/2016 2045   Sepsis Labs: @LABRCNTIP (procalcitonin:4,lacticidven:4) )No results found for this or any previous visit (from the past 240 hour(s)).   Radiological Exams on Admission: Dg Abd 2 Views  Result Date: 01/23/2016 CLINICAL DATA:  Nausea vomiting and generalized abdominal pain.  EXAM: ABDOMEN - 2 VIEW COMPARISON:  CT of the abdomen and pelvis 12/27/2015 FINDINGS: The bowel gas pattern is nonobstructive, with clustering of small bowel loops in the center of the abdomen, consistent with presence of moderate amount of ascites. There is relative paucity of gas in the colon. No evidence of organomegaly. IMPRESSION: Nonobstructive bowel gas pattern. Moderate amount of abdominal ascites. Electronically Signed   By: Fidela Salisbury M.D.   On: 01/23/2016 21:26    Assessment/Plan Principal Problem:   Ascites Active Problems:   Urinary tract infectious disease   Pancreatic mass   Abdominal pain    1. 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 Follow paracentesis labs. For now patient is on ceftriaxone. 2. Hypokalemia and hyponatremia - probably from diuretics and also poor oral intake. Closely follow metabolic panel. Replace potassium and recheck. Check magnesium levels. 3. Ascites on diuretics - not sure of diuretics would help if it is malignant ascites. Holding off diuretics for now since patient has significant hypokalemia and can be restarted after  correction of hypokalemia. Spironolactone can be restarted after hyponatremia improves. 4. Pancreatic mass concerning for malignancy - patient has requested no further workup. Planning to go to [place], his hometown later this weekend. 5. Elevated LFTs - possibly from metastasis. Follow LFTs. Patient states he has not had any alcohol since last month.   DVT prophylaxis: Lovenox. Code Status: DO NOT RESUSCITATE.  Family Communication: Discussed with patient.  Disposition Plan: Home.  Consults called: None.  Admission status: Observation.    Rise Patience MD [facility] Pager [phone].  If 7PM-7AM, please contact night-coverage [email or web address] Password [email or web address]  01/24/2016, 5:08 AM

## 2016-01-24 NOTE — ED Notes (Signed)
Dr.Nguyen at bedside with Fredderick Phenix. PA for paracentesis of abdomen.

## 2016-01-24 NOTE — ED Notes (Signed)
Pt reports improvement in pain after paracentesis. Friend at bedside.

## 2016-01-24 NOTE — Progress Notes (Signed)
Pharmacy Antibiotic Note  Keith Hart is a 45 y.o. male admitted on 01/23/2016 with UTI.  Pharmacy has been consulted for Ceftriaxone dosing.  Plan: Ceftriaxone 1gm iv q24hr  Height: 5\' 6"  (167.6 cm) Weight: 139 lb 15.9 oz (63.5 kg) IBW/kg (Calculated) : 63.8  Temp (24hrs), Avg:98.7 F (37.1 C), Min:97.5 F (36.4 C), Max:100.9 F (38.3 C)   Recent Labs Lab 01/23/16 2049 01/24/16 0607  WBC 7.9 4.2  CREATININE 0.84  --     Estimated Creatinine Clearance: 99.7 mL/min (by C-G formula based on SCr of 0.84 mg/dL).    Allergies  Allergen Reactions  . Other Other (See Comments)    Patient does not eat [place] per religious preference and it will make patient sick.  . Pork-Derived Products Other (See Comments)    Per religious preference and will make patient sick.    Antimicrobials this admission: Ceftriaxone 01/23/16 >>  Dose adjustments this admission: -  Microbiology results: pending  Thank you for allowing pharmacy to be a part of this patient's care.  Nani Skillern Crowford 01/24/2016 6:41 AM

## 2016-01-25 LAB — URINE CULTURE: Culture: <10000 — AB

## 2016-01-28 LAB — CULTURE, BLOOD (ROUTINE X 2)
Culture: NO GROWTH
Culture: NO GROWTH

## 2016-01-29 LAB — CULTURE, BODY FLUID W GRAM STAIN -BOTTLE

## 2016-01-29 LAB — CULTURE, BODY FLUID-BOTTLE: CULTURE: NO GROWTH

## 2016-02-16 ENCOUNTER — Emergency Department (HOSPITAL_COMMUNITY): Payer: BLUE CROSS/BLUE SHIELD

## 2016-02-16 ENCOUNTER — Inpatient Hospital Stay (HOSPITAL_COMMUNITY)
Admission: EM | Admit: 2016-02-16 | Discharge: 2016-02-19 | DRG: 187 | Disposition: A | Discharge status: Hospice | Payer: BLUE CROSS/BLUE SHIELD | Attending: Internal Medicine | Admitting: Internal Medicine

## 2016-02-16 ENCOUNTER — Encounter (HOSPITAL_COMMUNITY): Payer: Self-pay | Admitting: Emergency Medicine

## 2016-02-16 DIAGNOSIS — K219 Gastro-esophageal reflux disease without esophagitis: Secondary | ICD-10-CM | POA: Diagnosis present

## 2016-02-16 DIAGNOSIS — Z66 Do not resuscitate: Secondary | ICD-10-CM | POA: Diagnosis present

## 2016-02-16 DIAGNOSIS — R000 Tachycardia, unspecified: Secondary | ICD-10-CM

## 2016-02-16 DIAGNOSIS — D649 Anemia, unspecified: Secondary | ICD-10-CM | POA: Diagnosis present

## 2016-02-16 DIAGNOSIS — R1084 Generalized abdominal pain: Secondary | ICD-10-CM

## 2016-02-16 DIAGNOSIS — J90 Pleural effusion, not elsewhere classified: Principal | ICD-10-CM

## 2016-02-16 DIAGNOSIS — R7989 Other specified abnormal findings of blood chemistry: Secondary | ICD-10-CM | POA: Diagnosis present

## 2016-02-16 DIAGNOSIS — Z87891 Personal history of nicotine dependence: Secondary | ICD-10-CM

## 2016-02-16 DIAGNOSIS — R109 Unspecified abdominal pain: Secondary | ICD-10-CM | POA: Diagnosis present

## 2016-02-16 DIAGNOSIS — C799 Secondary malignant neoplasm of unspecified site: Secondary | ICD-10-CM | POA: Diagnosis present

## 2016-02-16 DIAGNOSIS — I10 Essential (primary) hypertension: Secondary | ICD-10-CM | POA: Diagnosis present

## 2016-02-16 DIAGNOSIS — K8689 Other specified diseases of pancreas: Secondary | ICD-10-CM | POA: Diagnosis present

## 2016-02-16 DIAGNOSIS — R188 Other ascites: Secondary | ICD-10-CM

## 2016-02-16 DIAGNOSIS — K869 Disease of pancreas, unspecified: Secondary | ICD-10-CM | POA: Diagnosis present

## 2016-02-16 DIAGNOSIS — R079 Chest pain, unspecified: Secondary | ICD-10-CM

## 2016-02-16 DIAGNOSIS — Z8249 Family history of ischemic heart disease and other diseases of the circulatory system: Secondary | ICD-10-CM

## 2016-02-16 DIAGNOSIS — R140 Abdominal distension (gaseous): Secondary | ICD-10-CM | POA: Diagnosis not present

## 2016-02-16 DIAGNOSIS — Z888 Allergy status to other drugs, medicaments and biological substances status: Secondary | ICD-10-CM

## 2016-02-16 DIAGNOSIS — Z9889 Other specified postprocedural states: Secondary | ICD-10-CM

## 2016-02-16 DIAGNOSIS — R1011 Right upper quadrant pain: Secondary | ICD-10-CM | POA: Diagnosis not present

## 2016-02-16 DIAGNOSIS — E871 Hypo-osmolality and hyponatremia: Secondary | ICD-10-CM

## 2016-02-16 DIAGNOSIS — R600 Localized edema: Secondary | ICD-10-CM | POA: Diagnosis present

## 2016-02-16 LAB — COMPREHENSIVE METABOLIC PANEL
ALK PHOS: 330 U/L — AB (ref 38–126)
ALT: 68 U/L — AB (ref 17–63)
AST: 179 U/L — AB (ref 15–41)
Albumin: 2 g/dL — ABNORMAL LOW (ref 3.5–5.0)
Anion gap: 8 (ref 5–15)
BUN: 7 mg/dL (ref 6–20)
CALCIUM: 7.7 mg/dL — AB (ref 8.9–10.3)
CHLORIDE: 99 mmol/L — AB (ref 101–111)
CO2: 23 mmol/L (ref 22–32)
CREATININE: 0.73 mg/dL (ref 0.61–1.24)
Glucose, Bld: 93 mg/dL (ref 65–99)
Potassium: 3.8 mmol/L (ref 3.5–5.1)
SODIUM: 130 mmol/L — AB (ref 135–145)
Total Bilirubin: 3.8 mg/dL — ABNORMAL HIGH (ref 0.3–1.2)
Total Protein: 6.4 g/dL — ABNORMAL LOW (ref 6.5–8.1)

## 2016-02-16 LAB — CBC WITH DIFFERENTIAL/PLATELET
BASOS ABS: 0.1 10*3/uL (ref 0.0–0.1)
Basophils Relative: 1 %
EOS PCT: 1 %
Eosinophils Absolute: 0.1 10*3/uL (ref 0.0–0.7)
HCT: 34.9 % — ABNORMAL LOW (ref 39.0–52.0)
HEMOGLOBIN: 12.4 g/dL — AB (ref 13.0–17.0)
LYMPHS ABS: 0.4 10*3/uL — AB (ref 0.7–4.0)
LYMPHS PCT: 4 %
MCH: 29.5 pg (ref 26.0–34.0)
MCHC: 35.5 g/dL (ref 30.0–36.0)
MCV: 82.9 fL (ref 78.0–100.0)
Monocytes Absolute: 1.6 10*3/uL — ABNORMAL HIGH (ref 0.1–1.0)
Monocytes Relative: 17 %
NEUTROS PCT: 77 %
Neutro Abs: 7.1 10*3/uL (ref 1.7–7.7)
PLATELETS: 260 10*3/uL (ref 150–400)
RBC: 4.21 MIL/uL — AB (ref 4.22–5.81)
RDW: 17.7 % — ABNORMAL HIGH (ref 11.5–15.5)
WBC: 9.3 10*3/uL (ref 4.0–10.5)

## 2016-02-16 LAB — LIPASE, BLOOD: Lipase: 55 U/L — ABNORMAL HIGH (ref 11–51)

## 2016-02-16 LAB — CBG MONITORING, ED: Glucose-Capillary: 95 mg/dL (ref 65–99)

## 2016-02-16 MED ORDER — MORPHINE SULFATE (PF) 4 MG/ML IV SOLN
4.0000 mg | Freq: Once | INTRAVENOUS | Status: AC
  Administered 2016-02-16: 4 mg via INTRAVENOUS
  Filled 2016-02-16: qty 1

## 2016-02-16 MED ORDER — SODIUM CHLORIDE 0.9 % IV SOLN
Freq: Once | INTRAVENOUS | Status: AC
  Administered 2016-02-16: 150 mL/h via INTRAVENOUS

## 2016-02-16 NOTE — ED Triage Notes (Addendum)
Pt from home via EMS with complaints of abdominal pain and bloating pt has hx of pancreatic cancer. Pt denies n, v,d. Pt is just here for bloating and pain in his abdomen. Pt is distended. And has new onset bilateral lower extremity swelling x 2 days. Pt states hs has also had a cough x 2 days. Pt is tachycardic at time of assessment.

## 2016-02-16 NOTE — ED Provider Notes (Signed)
[facility] DEPT Provider Note   CSN: [id number] Arrival date & time: 02/16/16  1928  By signing my name below, I, Dora Sims, attest that this documentation has been prepared under the direction and in the presence of [employer], PA-C. Electronically Signed: Dora Sims, Scribe. 02/16/2016. 10:02 PM.   History   Chief Complaint Chief Complaint  Patient presents with  . Abdominal Pain     The history is provided by the patient, medical records and a friend. No language interpreter was used.     HPI Comments: Keith Hart is a 45 y.o. male brought in by EMS, with PMHx of pancreatic cancer stage IV, gastritis, HTN, ascites, SIRS, and GERD, who presents to the Emergency Department complaining of sudden onset, constant, abdominal pain and distension for the last several hours. Pt notes h/o abdominal ascites and had a paracentesis here on July 25th. He notes h/o paracentesis x2. Pt is not undergoing any treatment for his pancreatic cancer. Pt also notes recent cough, intermittent vomiting that is orange/yellow tinged, and bilateral leg swelling. He notes some burning chest pain with his cough and notes his cough is worse at night. Pt tried to go to [place] 2 weeks ago to be with his family but was not allowed entry because he doesn't have a visa; he states he spent about 50 hours on a plane and his leg swelling began shortly thereafter. He eats a lot of red onion and was advised to stop eating meat after his paracentesis on July 25th. He states his abdominal pain is gradually improving. Pt denies nausea, fever, chills, or any other associated symptoms.  The history was provided by a friend of the patient.  Past Medical History:  Diagnosis Date  . Daily headache   . Gastritis   . GERD (gastroesophageal reflux disease)   . Hypertension   . Migraine    "alot; due to coughing" (12/28/2015)  . Pancreatic mass 12/28/2015  . Renal disorder    Pt reports Kidney problems  intermittenlty. Unknown specifics    Patient Active Problem List   Diagnosis Date Noted  . SIRS (systemic inflammatory response syndrome) ([place]) 01/24/2016  . Abdominal pain 01/24/2016  . Right upper quadrant pain   . Palliative care encounter   . Goals of care, counseling/discussion   . DNR (do not resuscitate)   . Pancreatic mass 12/27/2015  . Ascites 12/27/2015  . Hemoptysis 08/18/2014  . HTN (hypertension) 08/04/2014  . Establishing care with new doctor, encounter for 08/04/2014  . Urinary tract infectious disease 08/04/2014    Past Surgical History:  Procedure Laterality Date  . NO PAST SURGERIES       Home Medications    Prior to Admission medications   Medication Sig Start Date End Date Taking? Authorizing Provider  feeding supplement, ENSURE ENLIVE, (ENSURE ENLIVE) LIQD Take 237 mLs by mouth 2 (two) times daily between meals. Patient not taking: Reported on 02/16/2016 12/29/15   Florencia Reasons, MD  furosemide (LASIX) 40 MG tablet Take 1 tablet (40 mg total) by mouth daily. Patient not taking: Reported on 02/16/2016 12/29/15   Florencia Reasons, MD  oxyCODONE-acetaminophen (PERCOCET/ROXICET) 5-325 MG tablet Take 1 tablet by mouth every 6 (six) hours. Patient not taking: Reported on 02/16/2016 12/29/15   Florencia Reasons, MD  potassium chloride (K-DUR) 10 MEQ tablet Take 1 tablet (10 mEq total) by mouth daily. Patient not taking: Reported on 02/16/2016 01/24/16   Bonnell Public, MD  propranolol (INDERAL) 10 MG tablet Take 1 tablet (  10 mg total) by mouth 3 (three) times daily. Patient not taking: Reported on 02/16/2016 12/29/15   Florencia Reasons, MD  spironolactone (ALDACTONE) 100 MG tablet Take 1 tablet (100 mg total) by mouth daily. Patient not taking: Reported on 02/16/2016 12/29/15   Florencia Reasons, MD    Family History Family History  Problem Relation Age of Onset  . Hypertension Mother     Social History Social History  Substance Use Topics  . Smoking status: Former Smoker    Packs/day: 0.50    Years:  6.00    Types: Cigarettes  . Smokeless tobacco: Never Used     Comment: "quit smoking in ~ 1995  . Alcohol use 12.0 oz/week    20 Cans of beer per week     Comment: 12/28/2015 "3, 40oz beers, 2 days/week"     Allergies   Other and Pork-derived products   Review of Systems Review of Systems  Constitutional: Negative for chills and fever.  Respiratory: Positive for cough.   Cardiovascular: Positive for chest pain (with cough) and leg swelling (bilateral).  Gastrointestinal: Positive for abdominal distention, abdominal pain and vomiting. Negative for nausea.  All other systems reviewed and are negative.   Physical Exam Updated Vital Signs BP 122/80 (BP Location: Left Arm)   Pulse 119   Temp 99.8 F (37.7 C) (Oral)   Resp 17   SpO2 94%   Physical Exam  Constitutional: He appears well-developed and well-nourished. He appears cachectic. No distress.  Awake, alert, nontoxic appearance, uncomfortable appearing,  HENT:  Head: Normocephalic and atraumatic.  Mouth/Throat: Oropharynx is clear and moist. No oropharyngeal exudate.  Eyes: Conjunctivae are normal. No scleral icterus.  Neck: Normal range of motion. Neck supple.  Cardiovascular: Regular rhythm and intact distal pulses.  Tachycardia present.   Pulses:      Radial pulses are 2+ on the right side, and 2+ on the left side.  Pulmonary/Chest: Effort normal and breath sounds normal. No respiratory distress. He has no wheezes.  Equal chest expansion  Abdominal: Soft. He exhibits distension, fluid wave and ascites. He exhibits no mass. Bowel sounds are decreased. There is generalized tenderness. There is no rigidity, no rebound and no guarding.  Musculoskeletal: Normal range of motion. He exhibits no edema.  2+ pitting edema in the bilateral lower extremities from the knee to the forefoot  Neurological: He is alert.  Speech is clear and goal oriented Moves extremities without ataxia  Skin: Skin is warm and dry. He is not  diaphoretic.  Psychiatric: He has a normal mood and affect.  Nursing note and vitals reviewed.   ED Treatments / Results  Labs (all labs ordered are listed, but only abnormal results are displayed) Labs Reviewed  CBC WITH DIFFERENTIAL/PLATELET - Abnormal; Notable for the following:       Result Value   RBC 4.21 (*)    Hemoglobin 12.4 (*)    HCT 34.9 (*)    RDW 17.7 (*)    Lymphs Abs 0.4 (*)    Monocytes Absolute 1.6 (*)    All other components within normal limits  COMPREHENSIVE METABOLIC PANEL - Abnormal; Notable for the following:    Sodium 130 (*)    Chloride 99 (*)    Calcium 7.7 (*)    Total Protein 6.4 (*)    Albumin 2.0 (*)    AST 179 (*)    ALT 68 (*)    Alkaline Phosphatase 330 (*)    Total Bilirubin 3.8 (*)  All other components within normal limits  LIPASE, BLOOD - Abnormal; Notable for the following:    Lipase 55 (*)    All other components within normal limits  URINALYSIS, ROUTINE W REFLEX MICROSCOPIC (NOT AT [facility]) - Abnormal; Notable for the following:    Color, Urine ORANGE (*)    Bilirubin Urine MODERATE (*)    Nitrite POSITIVE (*)    All other components within normal limits  URINE MICROSCOPIC-ADD ON - Abnormal; Notable for the following:    Squamous Epithelial / LPF 0-5 (*)    Bacteria, UA RARE (*)    All other components within normal limits  CBG MONITORING, ED  CBG MONITORING, ED    EKG  EKG Interpretation None       Radiology Dg Chest 2 View  Result Date: 02/16/2016 CLINICAL DATA:  Mid chest pain, shortness of breath, and cough for 2 weeks. Recent overseas travel. Symptoms began after trip. History of hypertension. Recent diagnosis of pancreatic mass. Previous smoker. EXAM: CHEST  2 VIEW COMPARISON:  12/27/2015 FINDINGS: Shallow inspiration with elevation of the right hemidiaphragm. There is infiltration or atelectasis in the right lung base. Possibly also a small right pleural effusion. Changes may indicate pneumonia. No similar findings  were present on the previous study suggesting that this represents an acute process rather than a mass lesion. However, followup PA and lateral chest X-ray is recommended in 3-4 weeks following trial of antibiotic therapy to ensure resolution and exclude underlying malignancy. The left lung is clear. Heart size and pulmonary vascularity are normal. IMPRESSION: Elevation of the right hemidiaphragm with atelectasis or infiltration in the right lung base and probable small right pleural effusion. Changes likely to represent pneumonia. Followup PA and lateral chest X-ray is recommended in 3-4 weeks following trial of antibiotic therapy to ensure resolution and exclude underlying malignancy. Electronically Signed   By: Lucienne Capers M.D.   On: 02/16/2016 23:11   Ct Angio Chest Pe W And/or Wo Contrast  Result Date: 02/17/2016 CLINICAL DATA:  Tachycardia. Cough. Leg swelling. Abdominal pain and bloating. History of pancreatic cancer. EXAM: CT ANGIOGRAPHY CHEST CT ABDOMEN AND PELVIS WITH CONTRAST TECHNIQUE: Multidetector CT imaging of the chest was performed using the standard protocol during bolus administration of intravenous contrast. Multiplanar CT image reconstructions and MIPs were obtained to evaluate the vascular anatomy. Multidetector CT imaging of the abdomen and pelvis was performed using the standard protocol during bolus administration of intravenous contrast. CONTRAST:  100 mL Isovue 370 COMPARISON:  CT abdomen and pelvis 12/27/2015. CT chest abdomen and pelvis 11/27/2014. FINDINGS: CTA CHEST FINDINGS Technically adequate study with good opacification of the central and segmental pulmonary arteries. No focal filling defects demonstrated. No evidence of significant pulmonary embolus. Normal heart size. Normal caliber thoracic aorta. No aortic dissection. Great vessel origins are patent. Streak artifact arising from contrast limits evaluation of the mediastinum but no gross mediastinal lymphadenopathy is  appreciated. There is a mildly enlarged lymph node in the right and left cardiophrenic angle, measuring about 10 mm diameter. These are suspicious for metastatic lesions. There is a large right pleural effusion with atelectasis or consolidation in the right lung base. Linear atelectasis throughout the aerated portion of the right lung. Left lung appears clear and expanded. Motion artifact limits evaluation of the lungs. No pneumothorax. The pleural effusion is increasing since previous study. CT ABDOMEN and PELVIS FINDINGS Complex cystic mass in the tail of the pancreas with extension into the splenic hilum. Low density enlarged lymph nodes  demonstrated in the celiac axis and gastrohepatic ligament consistent with metastatic disease. Diffuse free fluid throughout the abdomen and pelvis consistent with ascites, possibly representing malignant ascites. Diffuse infiltration in the mesenteric may represent edema or peritoneal carcinomatosis. Ascites may be slightly more prominent than on prior study but overall the appearances are similar. Stomach, small bowel, and colon are not abnormally distended but there is evidence of small bowel wall thickening, probably reactive. Scattered stool in the colon. No free air. No focal liver lesions demonstrated. Mild gallbladder wall edema is likely reactive. No bile duct dilatation. 16 mm left adrenal gland nodule is probably metastatic. Kidneys are symmetrical without hydronephrosis or mass lesion. Aorta and IVC appear normal. Mild prominence of low-density lymph nodes in the retroperitoneum, probably metastatic. Pelvis: Bladder is decompressed. Prostate gland is not enlarged. No evidence of diverticulitis. Appendix is not identified. No pelvic lymphadenopathy. Review of the MIP images confirms the above findings. IMPRESSION: No evidence of significant pulmonary embolus. New large right pleural effusion with basilar atelectasis. Probable metastatic lymph nodes in the right and  left cardiophrenic angle. Multiloculated cystic mass in the tail of the pancreas and extending into the splenic hilum. Diffuse metastatic lymphadenopathy in the upper abdomen and retroperitoneum. 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 Diffuse ascites, demonstrating mild increase since prior study. Diffuse small bowel wall thickening is probably reactive. Electronically Signed   By: Lucienne Capers M.D.   On: 02/17/2016 01:02   Ct Abdomen Pelvis W Contrast  Result Date: 02/17/2016 CLINICAL DATA:  Tachycardia. Cough. Leg swelling. Abdominal pain and bloating. History of pancreatic cancer. EXAM: CT ANGIOGRAPHY CHEST CT ABDOMEN AND PELVIS WITH CONTRAST TECHNIQUE: Multidetector CT imaging of the chest was performed using the standard protocol during bolus administration of intravenous contrast. Multiplanar CT image reconstructions and MIPs were obtained to evaluate the vascular anatomy. Multidetector CT imaging of the abdomen and pelvis was performed using the standard protocol during bolus administration of intravenous contrast. CONTRAST:  100 mL Isovue 370 COMPARISON:  CT abdomen and pelvis 12/27/2015. CT chest abdomen and pelvis 11/27/2014. FINDINGS: CTA CHEST FINDINGS Technically adequate study with good opacification of the central and segmental pulmonary arteries. No focal filling defects demonstrated. No evidence of significant pulmonary embolus. Normal heart size. Normal caliber thoracic aorta. No aortic dissection. Great vessel origins are patent. Streak artifact arising from contrast limits evaluation of the mediastinum but no gross mediastinal lymphadenopathy is appreciated. There is a mildly enlarged lymph node in the right and left cardiophrenic angle, measuring about 10 mm diameter. These are suspicious for metastatic lesions. There is a large right pleural effusion with atelectasis or consolidation in the right lung base. Linear atelectasis throughout the aerated portion of the right lung. Left lung  appears clear and expanded. Motion artifact limits evaluation of the lungs. No pneumothorax. The pleural effusion is increasing since previous study. CT ABDOMEN and PELVIS FINDINGS Complex cystic mass in the tail of the pancreas with extension into the splenic hilum. Low density enlarged lymph nodes demonstrated in the celiac axis and gastrohepatic ligament consistent with metastatic disease. Diffuse free fluid throughout the abdomen and pelvis consistent with ascites, possibly representing malignant ascites. Diffuse infiltration in the mesenteric may represent edema or peritoneal carcinomatosis. Ascites may be slightly more prominent than on prior study but overall the appearances are similar. Stomach, small bowel, and colon are not abnormally distended but there is evidence of small bowel wall thickening, probably reactive. Scattered stool in the colon. No free air. No focal liver lesions demonstrated. Mild gallbladder  wall edema is likely reactive. No bile duct dilatation. 16 mm left adrenal gland nodule is probably metastatic. Kidneys are symmetrical without hydronephrosis or mass lesion. Aorta and IVC appear normal. Mild prominence of low-density lymph nodes in the retroperitoneum, probably metastatic. Pelvis: Bladder is decompressed. Prostate gland is not enlarged. No evidence of diverticulitis. Appendix is not identified. No pelvic lymphadenopathy. Review of the MIP images confirms the above findings. IMPRESSION: No evidence of significant pulmonary embolus. New large right pleural effusion with basilar atelectasis. Probable metastatic lymph nodes in the right and left cardiophrenic angle. Multiloculated cystic mass in the tail of the pancreas and extending into the splenic hilum. Diffuse metastatic lymphadenopathy in the upper abdomen and retroperitoneum. 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 Diffuse ascites, demonstrating mild increase since prior study. Diffuse small bowel wall thickening is probably  reactive. Electronically Signed   By: Lucienne Capers M.D.   On: 02/17/2016 01:02    Procedures Procedures (including critical care time)  DIAGNOSTIC STUDIES: Oxygen Saturation is 97% on RA, normal by my interpretation.    COORDINATION OF CARE: 10:03 PM Discussed treatment plan with pt and his friend at bedside and they agreed to plan.  Medications Ordered in ED Medications  0.9 %  sodium chloride infusion (150 mL/hr Intravenous New Bag/Given 02/16/16 2305)  morphine 4 MG/ML injection 4 mg (4 mg Intravenous Given 02/16/16 2325)  iopamidol (ISOVUE-370) 76 % injection 100 mL (100 mLs Intravenous Contrast Given 02/17/16 0013)     Initial Impression / Assessment and Plan / ED Course  I have reviewed the triage vital signs and the nursing notes.  Pertinent labs & imaging results that were available during my care of the patient were reviewed by me and considered in my medical decision making (see chart for details).  Clinical Course  Value Comment By Time   Pt resting more comfortably with pain controlled.   Jarrett Soho Celenia Hruska, PA-C 08/18 0128  WBC: 9.3 No leukocytosis, but anemia noted Abigail Butts, PA-C 08/18 0128  Sodium: (!) 130 Hyponatremia and hypochloremia.  Pt receiving fluids Abigail Butts, PA-C 08/18 0128  CT Angio Chest PE W and/or Wo Contrast No PE, increasing pleural effusion. Jarrett Soho Ninetta Adelstein, PA-C 08/18 0129  Pulse Rate: 111 Persistent tachycardia Abigail Butts, PA-C 08/18 0129   Discussed with Dr. Hal Hope who will admit to [place], PA-C 08/18 0209   Pt with large volume ascites on clinical exam.  NO PE on CTA, but increasing pleural effusion.  No SOB or CP.  No evidence of PNA.Marland Kitchen  Pt will need admission for pain control and therapeutic tap.  Afebrile, doubt SBP at this time.    I personally performed the services described in this documentation, which was scribed in my presence. The recorded information has been reviewed and  is accurate.  The patient was discussed with and seen by Dr. Roderic Palau who agrees with the treatment plan.    Final Clinical Impressions(s) / ED Diagnoses   Final diagnoses:  Generalized abdominal pain  Ascites  Tachycardia  Hyponatremia    New Prescriptions New Prescriptions   No medications on file     Abigail Butts, PA-C 02/17/16 [id number]    Milton Ferguson, MD 02/17/16 [phone]

## 2016-02-17 ENCOUNTER — Observation Stay (HOSPITAL_COMMUNITY): Payer: BLUE CROSS/BLUE SHIELD

## 2016-02-17 ENCOUNTER — Encounter (HOSPITAL_COMMUNITY): Payer: Self-pay | Admitting: Internal Medicine

## 2016-02-17 DIAGNOSIS — D649 Anemia, unspecified: Secondary | ICD-10-CM | POA: Diagnosis present

## 2016-02-17 DIAGNOSIS — R188 Other ascites: Secondary | ICD-10-CM | POA: Diagnosis present

## 2016-02-17 DIAGNOSIS — J948 Other specified pleural conditions: Secondary | ICD-10-CM

## 2016-02-17 DIAGNOSIS — R1011 Right upper quadrant pain: Secondary | ICD-10-CM | POA: Diagnosis not present

## 2016-02-17 DIAGNOSIS — R140 Abdominal distension (gaseous): Secondary | ICD-10-CM | POA: Diagnosis present

## 2016-02-17 DIAGNOSIS — J90 Pleural effusion, not elsewhere classified: Secondary | ICD-10-CM | POA: Diagnosis present

## 2016-02-17 DIAGNOSIS — R600 Localized edema: Secondary | ICD-10-CM | POA: Diagnosis present

## 2016-02-17 DIAGNOSIS — R609 Edema, unspecified: Secondary | ICD-10-CM

## 2016-02-17 DIAGNOSIS — K219 Gastro-esophageal reflux disease without esophagitis: Secondary | ICD-10-CM | POA: Diagnosis present

## 2016-02-17 DIAGNOSIS — Z87891 Personal history of nicotine dependence: Secondary | ICD-10-CM | POA: Diagnosis not present

## 2016-02-17 DIAGNOSIS — R7989 Other specified abnormal findings of blood chemistry: Secondary | ICD-10-CM | POA: Diagnosis present

## 2016-02-17 DIAGNOSIS — Z66 Do not resuscitate: Secondary | ICD-10-CM | POA: Diagnosis present

## 2016-02-17 DIAGNOSIS — C799 Secondary malignant neoplasm of unspecified site: Secondary | ICD-10-CM | POA: Diagnosis present

## 2016-02-17 DIAGNOSIS — Z8249 Family history of ischemic heart disease and other diseases of the circulatory system: Secondary | ICD-10-CM | POA: Diagnosis not present

## 2016-02-17 DIAGNOSIS — I10 Essential (primary) hypertension: Secondary | ICD-10-CM | POA: Diagnosis present

## 2016-02-17 DIAGNOSIS — Z888 Allergy status to other drugs, medicaments and biological substances status: Secondary | ICD-10-CM | POA: Diagnosis not present

## 2016-02-17 DIAGNOSIS — E871 Hypo-osmolality and hyponatremia: Secondary | ICD-10-CM | POA: Diagnosis present

## 2016-02-17 DIAGNOSIS — K869 Disease of pancreas, unspecified: Secondary | ICD-10-CM | POA: Diagnosis present

## 2016-02-17 LAB — CBG MONITORING, ED: Glucose-Capillary: 78 mg/dL (ref 65–99)

## 2016-02-17 LAB — BODY FLUID CELL COUNT WITH DIFFERENTIAL
Lymphs, Fluid: 50 %
Monocyte-Macrophage-Serous Fluid: 40 % — ABNORMAL LOW (ref 50–90)
NEUTROPHIL FLUID: 10 % (ref 0–25)
Total Nucleated Cell Count, Fluid: 440 cu mm (ref 0–1000)

## 2016-02-17 LAB — URINALYSIS, ROUTINE W REFLEX MICROSCOPIC
GLUCOSE, UA: NEGATIVE mg/dL
Hgb urine dipstick: NEGATIVE
KETONES UR: NEGATIVE mg/dL
LEUKOCYTES UA: NEGATIVE
Nitrite: POSITIVE — AB
PROTEIN: NEGATIVE mg/dL
Specific Gravity, Urine: 1.015 (ref 1.005–1.030)
pH: 6 (ref 5.0–8.0)

## 2016-02-17 LAB — PROTEIN, BODY FLUID

## 2016-02-17 LAB — URINE MICROSCOPIC-ADD ON: RBC / HPF: NONE SEEN RBC/hpf (ref 0–5)

## 2016-02-17 LAB — ALBUMIN, FLUID (OTHER)

## 2016-02-17 LAB — GRAM STAIN

## 2016-02-17 MED ORDER — ACETAMINOPHEN 650 MG RE SUPP: 650.0000 mg | Freq: Four times a day (QID) | RECTAL | Status: DC | PRN

## 2016-02-17 MED ORDER — ZOLPIDEM TARTRATE 5 MG PO TABS
5.0000 mg | ORAL_TABLET | Freq: Every evening | ORAL | Status: DC | PRN
  Administered 2016-02-17: 5 mg via ORAL
  Filled 2016-02-17: qty 1

## 2016-02-17 MED ORDER — ONDANSETRON HCL 4 MG/2ML IJ SOLN: 4.0000 mg | Freq: Four times a day (QID) | INTRAMUSCULAR | Status: DC | PRN

## 2016-02-17 MED ORDER — ONDANSETRON HCL 4 MG PO TABS: 4.0000 mg | ORAL_TABLET | Freq: Four times a day (QID) | ORAL | Status: DC | PRN

## 2016-02-17 MED ORDER — IOPAMIDOL (ISOVUE-370) INJECTION 76%
100.0000 mL | Freq: Once | INTRAVENOUS | Status: AC | PRN
  Administered 2016-02-17: 100 mL via INTRAVENOUS

## 2016-02-17 MED ORDER — LORAZEPAM 0.5 MG PO TABS
0.5000 mg | ORAL_TABLET | Freq: Once | ORAL | Status: AC
  Administered 2016-02-17: 0.5 mg via ORAL
  Filled 2016-02-17: qty 1

## 2016-02-17 MED ORDER — ACETAMINOPHEN 325 MG PO TABS
650.0000 mg | ORAL_TABLET | Freq: Four times a day (QID) | ORAL | Status: DC | PRN
  Administered 2016-02-18: 650 mg via ORAL
  Filled 2016-02-17: qty 2

## 2016-02-17 MED ORDER — MORPHINE SULFATE (PF) 2 MG/ML IV SOLN
1.0000 mg | INTRAVENOUS | Status: DC | PRN
  Administered 2016-02-17 – 2016-02-19 (×5): 1 mg via INTRAVENOUS
  Filled 2016-02-17 (×5): qty 1

## 2016-02-17 NOTE — ED Notes (Signed)
Pt cbg 78 

## 2016-02-17 NOTE — Progress Notes (Addendum)
Patient ID: Keith Hart, male   DOB: May 04, 1971, 45 y.o.   MRN: [id number]  Patient admitted after midnight. Please refer to admission note done 02/17/2016 for further details.  Patient seen and examined at the bedside. Family at the bedside.  45 y.o. male with recently diagnosed pancreatic mass with metastasis concerning for pancreatic cancer who has had paracentesis recently and had traveled to [place] and came back recently. Patient presented with presents increasing abdominal distention and increasing lower extremity swelling.  CT angiogram of the chest was negative for PE but did show large pleural effusion. CT abdomen showed ascites with known pancreatic mass. Patient admitted for paracentesis and possible thoracentesis.   Abdominal distention  / pleural effusion   - Likely malignant secondary to pancreatic mass - Paracentesis and thoracentesis today - Lower extremity Doppler not showing DVT - Continue supportive care  Of note, I told the pt and his family at the bedside if we can get a translator but family at the bedside refused to get anyone else to translate other then them.   Leisa Lenz [facility] [phone]

## 2016-02-17 NOTE — Progress Notes (Signed)
*  PRELIMINARY RESULTS* Vascular Ultrasound Lower extremity venous duplex has been completed.  Preliminary findings: No evidence of DVT or baker's cyst.   Landry Mellow, RDMS, RVT  02/17/2016, 9:32 AM

## 2016-02-17 NOTE — ED Notes (Signed)
Report given to Shannon, RN

## 2016-02-17 NOTE — Progress Notes (Signed)
Pt from [place] and speaks limited [place].  Pt friend at the bedside and has been translating for him.  Pt scheduled for paracentesis today.  Translator Ipad provided for explanation of procedure and for use when friend not available.  Pt refused [place] of translator service, stating friend can translate for him, he has had paracentesis before and he is uncomfortable with talking with other individual about his care.  Translator Ipad to remain in room in case needed and pt and friend verbalized understanding.

## 2016-02-17 NOTE — H&P (Signed)
History and Physical    Keith Hart [id number] DOB: [birth year] DOA: 02/16/2016  PCP: No PCP Per Patient  Patient coming from: Home.  Patient's friend provided translation.  Chief Complaint: Abdominal distention and cough.  HPI: Keith Hart is a 45 y.o. 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 Patient also recently has started developing cough nonproductive. Since patient had a long distance travel CT angiogram of the chest was done which was negative for PE but did show large pleural effusion. CT abdomen shows ascites with known pancreatic mass. Patient is being admitted for paracentesis and possible thoracentesis. Patient is not in distress.  Patient has had recent diagnosis of pancreatic mass and has elected no active treatment.   ED Course: See history of presenting illness.  Review of Systems: As per HPI, rest all negative.   Past Medical History:  Diagnosis Date  . Daily headache   . Gastritis   . GERD (gastroesophageal reflux disease)   . Hypertension   . Migraine    "alot; due to coughing" (12/28/2015)  . Pancreatic mass 12/28/2015  . Renal disorder    Pt reports Kidney problems intermittenlty. Unknown specifics    Past Surgical History:  Procedure Laterality Date  . NO PAST SURGERIES       reports that he has quit smoking. His smoking use included Cigarettes. He has a 3.00 pack-year smoking history. He has never used smokeless tobacco. He reports that he drinks about 12.0 oz of alcohol per week . He reports that he does not use drugs.  Allergies  Allergen Reactions  . Other Other (See Comments)    Patient does not eat [place] per religious preference and it will make patient sick.  . Pork-Derived Products Other (See Comments)   Per religious preference and will make patient sick.    Family History  Problem Relation Age of Onset  . Hypertension Mother     Prior to Admission medications   Medication Sig Start Date End Date Taking? Authorizing Provider  feeding supplement, ENSURE ENLIVE, (ENSURE ENLIVE) LIQD Take 237 mLs by mouth 2 (two) times daily between meals. Patient not taking: Reported on 02/16/2016 12/29/15   Florencia Reasons, MD  furosemide (LASIX) 40 MG tablet Take 1 tablet (40 mg total) by mouth daily. Patient not taking: Reported on 02/16/2016 12/29/15   Florencia Reasons, MD  oxyCODONE-acetaminophen (PERCOCET/ROXICET) 5-325 MG tablet Take 1 tablet by mouth every 6 (six) hours. Patient not taking: Reported on 02/16/2016 12/29/15   Florencia Reasons, MD  potassium chloride (K-DUR) 10 MEQ tablet Take 1 tablet (10 mEq total) by mouth daily. Patient not taking: Reported on 02/16/2016 01/24/16   Bonnell Public, MD  propranolol (INDERAL) 10 MG tablet Take 1 tablet (10 mg total) by mouth 3 (three) times daily. Patient not taking: Reported on 02/16/2016 12/29/15   Florencia Reasons, MD  spironolactone (ALDACTONE) 100 MG tablet Take 1 tablet (100 mg total) by mouth daily. Patient not taking: Reported on 02/16/2016 12/29/15   Florencia Reasons, MD    Physical Exam: Vitals:   02/17/16 0230 02/17/16 0300 02/17/16 0402 02/17/16 0410  BP: 140/89 131/75 131/81   Pulse: 113 114 (!) 125   Resp:   16   Temp:   99.3 F (37.4 C)   TempSrc:   Oral   SpO2: 93% 93% 100%  Weight:    151 lb 14.4 oz (68.9 kg)  Height:    5\' 7"  (1.702 m)      Constitutional: Not in distress appears cachectic. Vitals:   02/17/16 0230 02/17/16 0300 02/17/16 0402 02/17/16 0410  BP: 140/89 131/75 131/81   Pulse: 113 114 (!) 125   Resp:   16   Temp:   99.3 F (37.4 C)   TempSrc:   Oral   SpO2: 93% 93% 100%   Weight:    151 lb 14.4 oz (68.9 kg)  Height:    5\' 7"  (1.702 m)   Eyes: Anicteric no pallor. ENMT: No discharge from the ears eyes nose or mouth. Neck: No mass felt. No neck  rigidity. Respiratory: No rhonchi or crepitations. Cardiovascular: S1 and S2 heard. Abdomen: Distended soft nontender. Musculoskeletal: Bilateral lower Schmidt edema. Skin: No rash. Neurologic: Alert awake oriented to time place and person. Moves all extremities. Psychiatric: Appears normal.   Labs on Admission: I have personally reviewed following labs and imaging studies  CBC:  Recent Labs Lab 02/16/16 2300  WBC 9.3  NEUTROABS 7.1  HGB 12.4*  HCT 34.9*  MCV 82.9  PLT 123456   Basic Metabolic Panel:  Recent Labs Lab 02/16/16 2300  NA 130*  K 3.8  CL 99*  CO2 23  GLUCOSE 93  BUN 7  CREATININE 0.73  CALCIUM 7.7*   GFR: Estimated Creatinine Clearance: 109 mL/min (by C-G formula based on SCr of 0.8 mg/dL). Liver Function Tests:  Recent Labs Lab 02/16/16 2300  AST 179*  ALT 68*  ALKPHOS 330*  BILITOT 3.8*  PROT 6.4*  ALBUMIN 2.0*    Recent Labs Lab 02/16/16 2300  LIPASE 55*   No results for input(s): AMMONIA in the last 168 hours. Coagulation Profile: No results for input(s): INR, PROTIME in the last 168 hours. Cardiac Enzymes: No results for input(s): CKTOTAL, CKMB, CKMBINDEX, TROPONINI in the last 168 hours. BNP (last 3 results) No results for input(s): PROBNP in the last 8760 hours. HbA1C: No results for input(s): HGBA1C in the last 72 hours. CBG:  Recent Labs Lab 02/16/16 2251 02/17/16 0002  GLUCAP 95 78   Lipid Profile: No results for input(s): CHOL, HDL, LDLCALC, TRIG, CHOLHDL, LDLDIRECT in the last 72 hours. Thyroid Function Tests: No results for input(s): TSH, T4TOTAL, FREET4, T3FREE, THYROIDAB in the last 72 hours. Anemia Panel: No results for input(s): VITAMINB12, FOLATE, FERRITIN, TIBC, IRON, RETICCTPCT in the last 72 hours. Urine analysis:    Component Value Date/Time   COLORURINE ORANGE (A) 02/16/2016 0009   APPEARANCEUR CLEAR 02/16/2016 0009   LABSPEC 1.015 02/16/2016 0009   PHURINE 6.0 02/16/2016 0009   GLUCOSEU NEGATIVE  02/16/2016 0009   HGBUR NEGATIVE 02/16/2016 0009   BILIRUBINUR MODERATE (A) 02/16/2016 0009   BILIRUBINUR neg 08/04/2014 0956   KETONESUR NEGATIVE 02/16/2016 0009   PROTEINUR NEGATIVE 02/16/2016 0009   UROBILINOGEN 1.0 01/29/2015 1240   NITRITE POSITIVE (A) 02/16/2016 0009   LEUKOCYTESUR NEGATIVE 02/16/2016 0009   Sepsis Labs: @LABRCNTIP (procalcitonin:4,lacticidven:4) )No results found for this or any previous visit (from the past 240 hour(s)).   Radiological Exams on Admission: Dg Chest 2 View  Result Date: 02/16/2016 CLINICAL DATA:  Mid chest pain, shortness of breath, and cough for 2 weeks. Recent overseas travel. Symptoms began after trip. History of hypertension. Recent diagnosis of pancreatic mass. Previous smoker. EXAM: CHEST  2 VIEW COMPARISON:  12/27/2015 FINDINGS: Shallow inspiration with elevation of the right hemidiaphragm. There is infiltration or atelectasis in  the right lung base. Possibly also a small right pleural effusion. Changes may indicate pneumonia. No similar findings were present on the previous study suggesting that this represents an acute process rather than a mass lesion. However, followup PA and lateral chest X-ray is recommended in 3-4 weeks following trial of antibiotic therapy to ensure resolution and exclude underlying malignancy. The left lung is clear. Heart size and pulmonary vascularity are normal. IMPRESSION: Elevation of the right hemidiaphragm with atelectasis or infiltration in the right lung base and probable small right pleural effusion. Changes likely to represent pneumonia. Followup PA and lateral chest X-ray is recommended in 3-4 weeks following trial of antibiotic therapy to ensure resolution and exclude underlying malignancy. Electronically Signed   By: Lucienne Capers M.D.   On: 02/16/2016 23:11   Ct Angio Chest Pe W And/or Wo Contrast  Result Date: 02/17/2016 CLINICAL DATA:  Tachycardia. Cough. Leg swelling. Abdominal pain and bloating. History  of pancreatic cancer. EXAM: CT ANGIOGRAPHY CHEST CT ABDOMEN AND PELVIS WITH CONTRAST TECHNIQUE: Multidetector CT imaging of the chest was performed using the standard protocol during bolus administration of intravenous contrast. Multiplanar CT image reconstructions and MIPs were obtained to evaluate the vascular anatomy. Multidetector CT imaging of the abdomen and pelvis was performed using the standard protocol during bolus administration of intravenous contrast. CONTRAST:  100 mL Isovue 370 COMPARISON:  CT abdomen and pelvis 12/27/2015. CT chest abdomen and pelvis 11/27/2014. FINDINGS: CTA CHEST FINDINGS Technically adequate study with good opacification of the central and segmental pulmonary arteries. No focal filling defects demonstrated. No evidence of significant pulmonary embolus. Normal heart size. Normal caliber thoracic aorta. No aortic dissection. Great vessel origins are patent. Streak artifact arising from contrast limits evaluation of the mediastinum but no gross mediastinal lymphadenopathy is appreciated. There is a mildly enlarged lymph node in the right and left cardiophrenic angle, measuring about 10 mm diameter. These are suspicious for metastatic lesions. There is a large right pleural effusion with atelectasis or consolidation in the right lung base. Linear atelectasis throughout the aerated portion of the right lung. Left lung appears clear and expanded. Motion artifact limits evaluation of the lungs. No pneumothorax. The pleural effusion is increasing since previous study. CT ABDOMEN and PELVIS FINDINGS Complex cystic mass in the tail of the pancreas with extension into the splenic hilum. Low density enlarged lymph nodes demonstrated in the celiac axis and gastrohepatic ligament consistent with metastatic disease. Diffuse free fluid throughout the abdomen and pelvis consistent with ascites, possibly representing malignant ascites. Diffuse infiltration in the mesenteric may represent edema or  peritoneal carcinomatosis. Ascites may be slightly more prominent than on prior study but overall the appearances are similar. Stomach, small bowel, and colon are not abnormally distended but there is evidence of small bowel wall thickening, probably reactive. Scattered stool in the colon. No free air. No focal liver lesions demonstrated. Mild gallbladder wall edema is likely reactive. No bile duct dilatation. 16 mm left adrenal gland nodule is probably metastatic. Kidneys are symmetrical without hydronephrosis or mass lesion. Aorta and IVC appear normal. Mild prominence of low-density lymph nodes in the retroperitoneum, probably metastatic. Pelvis: Bladder is decompressed. Prostate gland is not enlarged. No evidence of diverticulitis. Appendix is not identified. No pelvic lymphadenopathy. Review of the MIP images confirms the above findings. IMPRESSION: No evidence of significant pulmonary embolus. New large right pleural effusion with basilar atelectasis. Probable metastatic lymph nodes in the right and left cardiophrenic angle. Multiloculated cystic mass in the tail of the  pancreas and extending into the splenic hilum. Diffuse metastatic lymphadenopathy in the upper abdomen and retroperitoneum. 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 Diffuse ascites, demonstrating mild increase since prior study. Diffuse small bowel wall thickening is probably reactive. Electronically Signed   By: Lucienne Capers M.D.   On: 02/17/2016 01:02   Ct Abdomen Pelvis W Contrast  Result Date: 02/17/2016 CLINICAL DATA:  Tachycardia. Cough. Leg swelling. Abdominal pain and bloating. History of pancreatic cancer. EXAM: CT ANGIOGRAPHY CHEST CT ABDOMEN AND PELVIS WITH CONTRAST TECHNIQUE: Multidetector CT imaging of the chest was performed using the standard protocol during bolus administration of intravenous contrast. Multiplanar CT image reconstructions and MIPs were obtained to evaluate the vascular anatomy. Multidetector CT imaging of the  abdomen and pelvis was performed using the standard protocol during bolus administration of intravenous contrast. CONTRAST:  100 mL Isovue 370 COMPARISON:  CT abdomen and pelvis 12/27/2015. CT chest abdomen and pelvis 11/27/2014. FINDINGS: CTA CHEST FINDINGS Technically adequate study with good opacification of the central and segmental pulmonary arteries. No focal filling defects demonstrated. No evidence of significant pulmonary embolus. Normal heart size. Normal caliber thoracic aorta. No aortic dissection. Great vessel origins are patent. Streak artifact arising from contrast limits evaluation of the mediastinum but no gross mediastinal lymphadenopathy is appreciated. There is a mildly enlarged lymph node in the right and left cardiophrenic angle, measuring about 10 mm diameter. These are suspicious for metastatic lesions. There is a large right pleural effusion with atelectasis or consolidation in the right lung base. Linear atelectasis throughout the aerated portion of the right lung. Left lung appears clear and expanded. Motion artifact limits evaluation of the lungs. No pneumothorax. The pleural effusion is increasing since previous study. CT ABDOMEN and PELVIS FINDINGS Complex cystic mass in the tail of the pancreas with extension into the splenic hilum. Low density enlarged lymph nodes demonstrated in the celiac axis and gastrohepatic ligament consistent with metastatic disease. Diffuse free fluid throughout the abdomen and pelvis consistent with ascites, possibly representing malignant ascites. Diffuse infiltration in the mesenteric may represent edema or peritoneal carcinomatosis. Ascites may be slightly more prominent than on prior study but overall the appearances are similar. Stomach, small bowel, and colon are not abnormally distended but there is evidence of small bowel wall thickening, probably reactive. Scattered stool in the colon. No free air. No focal liver lesions demonstrated. Mild  gallbladder wall edema is likely reactive. No bile duct dilatation. 16 mm left adrenal gland nodule is probably metastatic. Kidneys are symmetrical without hydronephrosis or mass lesion. Aorta and IVC appear normal. Mild prominence of low-density lymph nodes in the retroperitoneum, probably metastatic. Pelvis: Bladder is decompressed. Prostate gland is not enlarged. No evidence of diverticulitis. Appendix is not identified. No pelvic lymphadenopathy. Review of the MIP images confirms the above findings. IMPRESSION: No evidence of significant pulmonary embolus. New large right pleural effusion with basilar atelectasis. Probable metastatic lymph nodes in the right and left cardiophrenic angle. Multiloculated cystic mass in the tail of the pancreas and extending into the splenic hilum. Diffuse metastatic lymphadenopathy in the upper abdomen and retroperitoneum. 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 Diffuse ascites, demonstrating mild increase since prior study. Diffuse small bowel wall thickening is probably reactive. Electronically Signed   By: Lucienne Capers M.D.   On: 02/17/2016 01:02     Assessment/Plan Principal Problem:   Ascites Active Problems:   Pancreatic mass   Abdominal pain   Pleural effusion on right    1. Ascites and pleural effusion probably malignant with a known history  of pancreatic mass with metastasis concerning for pancreatic cancer - I have requested ultrasound-guided paracentesis. Repeat chest x-ray after paracentesis and if pleural effusion is still persistent then may need thoracentesis. Patient has elected no further treatment for his possible pancreatic cancer. Follow paracentesis labs. 2. Lower extremity edema - check Dopplers. 3. Anemia - follow CBC. 4. Elevated LFTs probably for metastasis.   DVT prophylaxis: Foot pump. Patient allergic to heparin. Code Status: DO NOT RESUSCITATE.  Family Communication: No family at the bedside.  Disposition Plan: Home.  Consults  called: None.  Admission status: Observation.    Rise Patience MD [facility] Pager [phone].  If 7PM-7AM, please contact night-coverage [email or web address] Password [email or web address]  02/17/2016, 5:10 AM

## 2016-02-17 NOTE — Progress Notes (Signed)
Patient refused telemetry.  MD notified.   

## 2016-02-17 NOTE — Procedures (Signed)
Ultrasound-guided diagnostic and therapeutic paracentesis performed yielding 2.1 liters of turbid, yellow fluid. No immediate complications.A portion of the fluid was sent to the lab for preordered studies.Also present was moderate right pleural effusion which can be drained on separate day if needed.

## 2016-02-18 ENCOUNTER — Inpatient Hospital Stay (HOSPITAL_COMMUNITY): Payer: BLUE CROSS/BLUE SHIELD

## 2016-02-18 DIAGNOSIS — R188 Other ascites: Secondary | ICD-10-CM

## 2016-02-18 LAB — BODY FLUID CELL COUNT WITH DIFFERENTIAL
LYMPHS FL: 64 %
MONOCYTE-MACROPHAGE-SEROUS FLUID: 21 % — AB (ref 50–90)
NEUTROPHIL FLUID: 15 % (ref 0–25)
WBC FLUID: 632 uL (ref 0–1000)

## 2016-02-18 LAB — CBC
HEMATOCRIT: 35.6 % — AB (ref 39.0–52.0)
HEMOGLOBIN: 12.7 g/dL — AB (ref 13.0–17.0)
MCH: 29.7 pg (ref 26.0–34.0)
MCHC: 35.7 g/dL (ref 30.0–36.0)
MCV: 83.2 fL (ref 78.0–100.0)
Platelets: 267 10*3/uL (ref 150–400)
RBC: 4.28 MIL/uL (ref 4.22–5.81)
RDW: 18.2 % — ABNORMAL HIGH (ref 11.5–15.5)
WBC: 10.1 10*3/uL (ref 4.0–10.5)

## 2016-02-18 LAB — BASIC METABOLIC PANEL
ANION GAP: 9 (ref 5–15)
BUN: 7 mg/dL (ref 6–20)
CALCIUM: 7.6 mg/dL — AB (ref 8.9–10.3)
CO2: 21 mmol/L — AB (ref 22–32)
Chloride: 100 mmol/L — ABNORMAL LOW (ref 101–111)
Creatinine, Ser: 0.59 mg/dL — ABNORMAL LOW (ref 0.61–1.24)
GLUCOSE: 81 mg/dL (ref 65–99)
POTASSIUM: 3.7 mmol/L (ref 3.5–5.1)
Sodium: 130 mmol/L — ABNORMAL LOW (ref 135–145)

## 2016-02-18 LAB — PROTEIN, BODY FLUID: Total protein, fluid: 2.8 g/dL

## 2016-02-18 LAB — GLUCOSE, SEROUS FLUID: Glucose, Fluid: 83 mg/dL

## 2016-02-18 LAB — LACTATE DEHYDROGENASE, PLEURAL OR PERITONEAL FLUID: LD, Fluid: 139 U/L — ABNORMAL HIGH (ref 3–23)

## 2016-02-18 MED ORDER — TRAMADOL HCL 50 MG PO TABS
50.0000 mg | ORAL_TABLET | Freq: Four times a day (QID) | ORAL | Status: DC | PRN
  Administered 2016-02-18 (×2): 50 mg via ORAL
  Filled 2016-02-18 (×2): qty 1

## 2016-02-18 MED ORDER — ALBUMIN HUMAN 25 % IV SOLN
25.0000 g | Freq: Once | INTRAVENOUS | Status: AC
  Administered 2016-02-18: 25 g via INTRAVENOUS
  Filled 2016-02-18: qty 100

## 2016-02-18 MED ORDER — DEXTROSE 5 % IV SOLN
2.0000 g | INTRAVENOUS | Status: DC
  Administered 2016-02-18 – 2016-02-19 (×2): 2 g via INTRAVENOUS
  Filled 2016-02-18 (×2): qty 2

## 2016-02-18 NOTE — Progress Notes (Signed)
Pharmacy Antibiotic Note  Keith Hart is a 45 y.o. male with metastatic pancreatic cancer admitted on 02/16/2016 with abdominal pain.  Pharmacy has been consulted for ceftriaxone dosing for r/o inta-abdominal infection/SBP.  Plan:  Ceftriaxone 2g IV x 24h  No further dosing adjustment needed, Pharmacy will sign off  Height: 5\' 7"  (170.2 cm) Weight: 147 lb 9.6 oz (67 kg) IBW/kg (Calculated) : 66.1  Temp (24hrs), Avg:100 F (37.8 C), Min:98.5 F (36.9 C), Max:101.7 F (38.7 C)   Recent Labs Lab 02/16/16 2300 02/18/16 0525  WBC 9.3 10.1  CREATININE 0.73 0.59*    Estimated Creatinine Clearance: 109 mL/min (by C-G formula based on SCr of 0.8 mg/dL).    Allergies  Allergen Reactions  . Other Other (See Comments)    Patient does not eat [place] per religious preference and it will make patient sick.  . Pork-Derived Products Other (See Comments)    Per religious preference and will make patient sick.  . Eggs Or Egg-Derived Products Nausea Only    Antimicrobials this admission: 8/19 >> Ceftriaxone >>  Dose adjustments this admission: ---  Microbiology results: 8/18 peritoneal fluid: no organisms seen on gram stain  Thank you for allowing pharmacy to be a part of this patient's care.  Peggyann Juba, PharmD, BCPS Pager: [phone] 02/18/2016 8:25 AM

## 2016-02-18 NOTE — Procedures (Signed)
Ultrasound-guided diagnostic and therapeutic right thoracentesis performed yielding 2 liters of turbid, amber colored fluid. No immediate complications. Follow-up chest x-ray pending. A portion of the fluid was sent to the lab for preordered studies.

## 2016-02-18 NOTE — Progress Notes (Signed)
[facility] Progress Note  Patient: Keith Hart [id number]   PCP: No PCP Per Patient DOB: [birth year]   DOA: 02/16/2016   DOS: 02/18/2016   Date of Service: the patient was seen and examined on 02/18/2016  Subjective: Patient complaining of pain in the right upper quadrant as well as on the right side of the chest after thoracentesis. Denies any nausea or vomiting. No diarrhea. Nutrition: Tolerating oral diet  Brief hospital course: Pt. with PMH of pancreatic cancer; admitted on 02/16/2016, with complaint of distention of the abdomen, was found to have ascites. Currently further plan is consulted palliative care.  Assessment and Plan: 1. Ascites Pancreatic cancer. Patient refusing any further aggressive workup and treatment for the pancreatic cancer. The patient is requesting symptomatic relief with relief from ascites and pleural effusion. Status post paracentesis as well as thoracentesis. Discussed with interventional radiology will place the patient is not a candidate for drain placement due to limited quantity of fluid availability on drainage. We will consult palliative care prior to discharge to arrange for hospice as the family has finally agreed.  Pain management: Morphine, tramadol Activity: Not needed physical therapy Bowel regimen: last BM 02/17/2016 Diet: Regular diet DVT Prophylaxis: mechanical compression device  Advance goals of care discussion: DNR/DNI, palliative care consult for hospice  Family Communication: family was present at bedside, at the time of interview. The pt provided permission to discuss medical plan with the family. Opportunity was given to ask question and all questions were answered satisfactorily.   Disposition:  Discharge to home. Expected discharge date: 02/19/2016, hospice arrangement  Consultants: Palliative care, interventional radiology Procedures: Paracentesis, thoracentesis  Antibiotics: Anti-infectives    Start      Dose/Rate Route Frequency Ordered Stop   02/18/16 1000  cefTRIAXone (ROCEPHIN) 2 g in dextrose 5 % 50 mL IVPB     2 g 100 mL/hr over 30 Minutes Intravenous Every 24 hours 02/18/16 0825          Intake/Output Summary (Last 24 hours) at 02/18/16 1826 Last data filed at 02/17/16 2235  Gross per 24 hour  Intake              120 ml  Output                0 ml  Net              120 ml   Filed Weights   02/17/16 0410 02/18/16 0717  Weight: 68.9 kg (151 lb 14.4 oz) 67 kg (147 lb 9.6 oz)    Objective: Physical Exam: Vitals:   02/18/16 0925 02/18/16 0959 02/18/16 1003 02/18/16 1500  BP: 113/72 106/68 100/71 112/73  Pulse:    (!) 122  Resp:    20  Temp:    99 F (37.2 C)  TempSrc:    Oral  SpO2:    100%  Weight:      Height:        General: Alert, Awake and Oriented to Time, Place and Person. Appear in moderate distress Eyes: PERRL, Conjunctiva normal ENT: Oral Mucosa clear moist. Neck: no JVD, no Abnormal Mass Or lumps Cardiovascular: S1 and S2 Present, no Murmur, Respiratory: Bilateral Air entry equal and Decreased, Clear to Auscultation, no Crackles, no wheezes Abdomen: Bowel Sound present, Soft and no tenderness Skin: no redness, no Rash  Extremities: no Pedal edema, no calf tenderness Neurologic: Grossly no focal neuro deficit. Bilaterally Equal motor strength  Data Reviewed: CBC:  Recent Labs Lab 02/16/16  2300 02/18/16 0525  WBC 9.3 10.1  NEUTROABS 7.1  --   HGB 12.4* 12.7*  HCT 34.9* 35.6*  MCV 82.9 83.2  PLT 260 [id number]   Basic Metabolic Panel:  Recent Labs Lab 02/16/16 2300 02/18/16 0525  NA 130* 130*  K 3.8 3.7  CL 99* 100*  CO2 23 21*  GLUCOSE 93 81  BUN 7 7  CREATININE 0.73 0.59*  CALCIUM 7.7* 7.6*    Liver Function Tests:  Recent Labs Lab 02/16/16 2300  AST 179*  ALT 68*  ALKPHOS 330*  BILITOT 3.8*  PROT 6.4*  ALBUMIN 2.0*    Recent Labs Lab 02/16/16 2300  LIPASE 55*   No results for input(s): AMMONIA in the last 168  hours. Coagulation Profile: No results for input(s): INR, PROTIME in the last 168 hours. Cardiac Enzymes: No results for input(s): CKTOTAL, CKMB, CKMBINDEX, TROPONINI in the last 168 hours. BNP (last 3 results) No results for input(s): PROBNP in the last 8760 hours.  CBG:  Recent Labs Lab 02/16/16 2251 02/17/16 0002  GLUCAP 95 78    Studies: Dg Chest 1 View  Result Date: 02/18/2016 CLINICAL DATA:  Status post right thoracentesis today. EXAM: CHEST 1 VIEW COMPARISON:  02/16/2016 FINDINGS: There has been a significant decrease in right pleural fluid following thoracentesis. No pneumothorax. Lungs are clear. Heart, mediastinum and hila are unremarkable. IMPRESSION: 1. Significant decrease right pleural fluid following right thoracentesis. No pneumothorax. Electronically Signed   By: Lajean Manes M.D.   On: 02/18/2016 10:57   US Thoracentesis Asp Pleural Space W/img Guide  Result Date: 02/18/2016 INDICATION: Pancreatic mass, ascites, bilateral pleural effusions right greater than left. Request made for diagnostic and therapeutic right thoracentesis. EXAM: ULTRASOUND GUIDED DIAGNOSTIC AND THERAPEUTIC RIGHT THORACENTESIS MEDICATIONS: None. COMPLICATIONS: None immediate. PROCEDURE: An ultrasound guided thoracentesis was thoroughly discussed with the patient and questions answered. The benefits, risks, alternatives and complications were also discussed. The patient understands and wishes to proceed with the procedure. Written consent was obtained. Ultrasound was performed to localize and mark an adequate pocket of fluid in the right chest. The area was then prepped and draped in the normal sterile fashion. 1% Lidocaine was used for local anesthesia. Under ultrasound guidance a Safe-T-Centesis catheter was introduced. Thoracentesis was performed. The catheter was removed and a dressing applied. FINDINGS: A total of approximately 2 liters of turbid, amber fluid was removed. Samples were sent to the  laboratory as requested by the clinical team. IMPRESSION: Successful ultrasound guided diagnostic and therapeutic right thoracentesis yielding 2 liters of pleural fluid. Read by: Rowe Robert, PA-C Electronically Signed   By: Jerilynn Mages.  Shick M.D.   On: 02/18/2016 10:16     Scheduled Meds: . cefTRIAXone (ROCEPHIN)  IV  2 g Intravenous Q24H   Continuous Infusions:  PRN Meds: acetaminophen **OR** acetaminophen, morphine injection, ondansetron **OR** ondansetron (ZOFRAN) IV, traMADol, zolpidem  Time spent: 30 minutes  Author: Berle Mull, MD [facility] Pager: [phone] 02/18/2016 6:26 PM  If 7PM-7AM, please contact night-coverage at [email or web address], password [facility]

## 2016-02-19 DIAGNOSIS — Z515 Encounter for palliative care: Secondary | ICD-10-CM

## 2016-02-19 DIAGNOSIS — K869 Disease of pancreas, unspecified: Secondary | ICD-10-CM

## 2016-02-19 DIAGNOSIS — Z7189 Other specified counseling: Secondary | ICD-10-CM

## 2016-02-19 MED ORDER — OXYCODONE HCL 5 MG PO TABS: 5.0000 mg | ORAL_TABLET | ORAL | Status: DC | PRN

## 2016-02-19 MED ORDER — OXYCODONE HCL 5 MG PO TABS: 5.0000 mg | ORAL_TABLET | Freq: Four times a day (QID) | ORAL | 0 refills | Status: AC | PRN

## 2016-02-19 MED ORDER — TRAZODONE HCL 50 MG PO TABS: 50.0000 mg | ORAL_TABLET | Freq: Every evening | ORAL | Status: DC | PRN

## 2016-02-19 MED ORDER — ONDANSETRON HCL 4 MG PO TABS: 4.0000 mg | ORAL_TABLET | Freq: Three times a day (TID) | ORAL | 0 refills | Status: AC | PRN

## 2016-02-19 MED ORDER — TRAZODONE HCL 50 MG PO TABS: 50.0000 mg | ORAL_TABLET | Freq: Every evening | ORAL | 0 refills | Status: AC | PRN

## 2016-02-19 NOTE — Progress Notes (Signed)
Met with patient, his friend, and hospice liasion.  He wants to discharge home with hospice support.  Hospice will admit (likely tomorrow) and can work as outpatient to further titrate medications.  On discharge, recommend scripts for:  Oxycodone 72m every 3 hours as needed for pain. Trazodone as needed at night for sleep. Zofran as needed.  Full consult to follow.  GMicheline Rough MD [place] [phone]

## 2016-02-19 NOTE — Progress Notes (Signed)
[facility] - [id number] [facility] RN Liaison Note Notified by Venita Sheffield [facility] of family request for Hospice and [place] services at home after discharge. Chart and patient information currently under review to confirm hospice eligibility.   Spoke with the patient and his friend at bedside along with Dr. Micheline Rough  to initiate education related to hospice philosophy, services and team approach to care. Family verbalized understanding of the information provided. Per discussion plan is for discharge to home by personal vehicle with friend today.   Please send signed completed DNR form home with patient.   Patient will need prescriptions for discharge comfort medications.   DME needs discussed; patient declined any need for DME at this time.  The home address has been verified and is correct in the chart;   [facility] aware of the above.   Completed discharge summary will need to be faxed to [facility] at [phone] when final.   Please notify [facility] when patient is ready to leave unit at discharge-call [phone].  [facility] information and contact numbers have been given to the patient and his friend  during visit.  Above information shared with Venita Sheffield, CMRN.  Please call with any questions.   Thank you!   Mickie Kay, [facility] Liaison  [phone]

## 2016-02-19 NOTE — Progress Notes (Signed)
Pt will leave this afternoon with his "friend". Alert and oriented. Palliative and Hospice consult done. Pt will leave with d/c instructions/prescription. "Friend" knows instructions and hospice information.

## 2016-02-19 NOTE — Care Management Note (Addendum)
Case Management Note  Patient Details  Name: Zacary Deloe MRN: [id number] Date of Birth: [birth year]  Subjective/Objective:                  Cancer  Action/Plan: CM received a call from Dr. Domingo Cocking, Palliative Care. Patient would like to be discharged home today with [facility]. CM spoke with patient and family member or friend at the bedside. CM spoke with Collie Siad, Liaison for [place]. Collie Siad will see the patient today.   Expected Discharge Date:   02/19/16               Expected Discharge Plan:  Home w Hospice Care  In-House Referral:     Discharge planning Services  CM Consult  Post Acute Care Choice:  Hospice Choice offered to:  Patient  DME Arranged:  N/A DME Agency:  NA  HH Arranged:  RN [place] Agency:  [facility]  Status of Service:  Completed, signed off  If discussed at [employer] of Stay Meetings, dates discussed:    Additional Comments:  Apolonio Schneiders, RN 02/19/2016, 10:54 AM

## 2016-02-20 LAB — PH, BODY FLUID: PH, BODY FLUID: 7.6

## 2016-02-21 LAB — BODY FLUID CULTURE: CULTURE: NO GROWTH

## 2016-02-21 NOTE — Discharge Summary (Signed)
[facility] Discharge Summary   Patient: Keith Hart [id number]   PCP: No PCP Per Patient DOB: 1971-02-16   Date of admission: 02/16/2016   Date of discharge: 02/19/2016    Discharge Diagnoses:  Principal Problem:   Ascites Active Problems:   Pancreatic mass   Abdominal pain   Pleural effusion on right   Admitted From: Home Disposition:  Home with hospice  Recommendations for Outpatient Follow-up:   Follow-up Information    hospice .   Why:  Establish Care with hospice         Diet recommendation: Regular diet  Activity: The patient is advised to gradually reintroduce usual activities.  Discharge Condition: fair  Code Status: DNR/DNI  History of present illness: As per the H and P dictated on admission, "Keith Hart is a 45 y.o. 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 Patient also recently has started developing cough nonproductive. Since patient had a long distance travel CT angiogram of the chest was done which was negative for PE but did show large pleural effusion. CT abdomen shows ascites with known pancreatic mass. Patient is being admitted for paracentesis and possible thoracentesis. Patient is not in distress. "  Hospital Course:  Summary of his active problems in the hospital is as following.  Principal Problem:   Ascites Active Problems:   Pancreatic mass   Abdominal pain   Pleural effusion on right Patient refusing any further aggressive workup and treatment for the pancreatic cancer. The patient is requesting symptomatic relief with relief from ascites and pleural effusion. Status post paracentesis as well as thoracentesis. Discussed with interventional radiology will place the patient is not a candidate for drain  placement due to limited quantity of fluid availability on drainage. We will consult palliative care prior to discharge to arrange for hospice as the family has finally agreed.  All other chronic medical condition were stable during the hospitalization.   On the day of the discharge the patient's symptoms are well controlled, and no other acute medical condition were reported by patient. the patient was felt safe to be discharge at home with hospice.  Procedures and Results:  Ultrasound-guided thoracentesis  Ultrasound-guided paracentesis   Consultations:  Interventional radiology  Palliative care  DISCHARGE MEDICATION: Discharge Medication List as of 02/19/2016 12:05 PM    START taking these medications   Details  ondansetron (ZOFRAN) 4 MG tablet Take 1 tablet (4 mg total) by mouth every 8 (eight) hours as needed for nausea or vomiting., Starting Sun 02/19/2016, Normal    oxyCODONE (OXY IR/ROXICODONE) 5 MG immediate release tablet Take 1 tablet (5 mg total) by mouth every 6 (six) hours as needed for severe pain., Starting Sun 02/19/2016, Print    traZODone (DESYREL) 50 MG tablet Take 1 tablet (50 mg total) by mouth at bedtime as needed for sleep., Starting Sun 02/19/2016, Normal      CONTINUE these medications which have NOT CHANGED   Details  feeding supplement, ENSURE ENLIVE, (ENSURE ENLIVE) LIQD Take 237 mLs by mouth 2 (two) times daily between meals., Starting Thu 12/29/2015, Normal    furosemide (LASIX) 40 MG tablet Take 1 tablet (40 mg total) by mouth daily., Starting Thu 12/29/2015, Normal    potassium chloride (K-DUR) 10 MEQ tablet Take 1 tablet (10 mEq total) by mouth daily., Starting Tue 01/24/2016, Normal  propranolol (INDERAL) 10 MG tablet Take 1 tablet (10 mg total) by mouth 3 (three) times daily., Starting Thu 12/29/2015, Normal    spironolactone (ALDACTONE) 100 MG tablet Take 1 tablet (100 mg total) by mouth daily., Starting Thu 12/29/2015, Normal      STOP taking  these medications     oxyCODONE-acetaminophen (PERCOCET/ROXICET) 5-325 MG tablet        Allergies  Allergen Reactions  . Other Other (See Comments)    Patient does not eat [place] per religious preference and it will make patient sick.  . Pork-Derived Products Other (See Comments)    Per religious preference and will make patient sick.  . Eggs Or Egg-Derived Products Nausea Only   Discharge Instructions    Increase activity slowly    Complete by:  As directed     Discharge Exam: Filed Weights   02/17/16 0410 02/18/16 0717 02/19/16 [id number]  Weight: 68.9 kg (151 lb 14.4 oz) 67 kg (147 lb 9.6 oz) 65.3 kg (144 lb)   Vitals:   02/18/16 2043 02/19/16 0608  BP: (!) 161/86 131/80  Pulse: (!) 130 (!) 132  Resp: (!) 22 20  Temp: 99.2 F (37.3 C) 99.2 F (37.3 C)   General: Appear in mild distress, no Rash; Oral Mucosa moist. Cardiovascular: S1 and S2 Present, no Murmur, no JVD Respiratory: Bilateral Air entry present and Clear to Auscultation, no Crackles, no wheezes Abdomen: Bowel Sound present, Soft and no tenderness Extremities: no Pedal edema, no calf tenderness Neurology: Grossly no focal neuro deficit.  The results of significant diagnostics from this hospitalization (including imaging, microbiology, ancillary and laboratory) are listed below for reference.    Significant Diagnostic Studies: Dg Chest 1 View  Result Date: 02/18/2016 CLINICAL DATA:  Status post right thoracentesis today. EXAM: CHEST 1 VIEW COMPARISON:  02/16/2016 FINDINGS: There has been a significant decrease in right pleural fluid following thoracentesis. No pneumothorax. Lungs are clear. Heart, mediastinum and hila are unremarkable. IMPRESSION: 1. Significant decrease right pleural fluid following right thoracentesis. No pneumothorax. Electronically Signed   By: Lajean Manes M.D.   On: 02/18/2016 10:57   Dg Chest 2 View  Result Date: 02/16/2016 CLINICAL DATA:  Mid chest pain, shortness of breath, and cough  for 2 weeks. Recent overseas travel. Symptoms began after trip. History of hypertension. Recent diagnosis of pancreatic mass. Previous smoker. EXAM: CHEST  2 VIEW COMPARISON:  12/27/2015 FINDINGS: Shallow inspiration with elevation of the right hemidiaphragm. There is infiltration or atelectasis in the right lung base. Possibly also a small right pleural effusion. Changes may indicate pneumonia. No similar findings were present on the previous study suggesting that this represents an acute process rather than a mass lesion. However, followup PA and lateral chest X-ray is recommended in 3-4 weeks following trial of antibiotic therapy to ensure resolution and exclude underlying malignancy. The left lung is clear. Heart size and pulmonary vascularity are normal. IMPRESSION: Elevation of the right hemidiaphragm with atelectasis or infiltration in the right lung base and probable small right pleural effusion. Changes likely to represent pneumonia. Followup PA and lateral chest X-ray is recommended in 3-4 weeks following trial of antibiotic therapy to ensure resolution and exclude underlying malignancy. Electronically Signed   By: Lucienne Capers M.D.   On: 02/16/2016 23:11   Ct Angio Chest Pe W And/or Wo Contrast  Result Date: 02/17/2016 CLINICAL DATA:  Tachycardia. Cough. Leg swelling. Abdominal pain and bloating. History of pancreatic cancer. EXAM: CT ANGIOGRAPHY CHEST CT ABDOMEN AND PELVIS WITH  CONTRAST TECHNIQUE: Multidetector CT imaging of the chest was performed using the standard protocol during bolus administration of intravenous contrast. Multiplanar CT image reconstructions and MIPs were obtained to evaluate the vascular anatomy. Multidetector CT imaging of the abdomen and pelvis was performed using the standard protocol during bolus administration of intravenous contrast. CONTRAST:  100 mL Isovue 370 COMPARISON:  CT abdomen and pelvis 12/27/2015. CT chest abdomen and pelvis 11/27/2014. FINDINGS: CTA CHEST  FINDINGS Technically adequate study with good opacification of the central and segmental pulmonary arteries. No focal filling defects demonstrated. No evidence of significant pulmonary embolus. Normal heart size. Normal caliber thoracic aorta. No aortic dissection. Great vessel origins are patent. Streak artifact arising from contrast limits evaluation of the mediastinum but no gross mediastinal lymphadenopathy is appreciated. There is a mildly enlarged lymph node in the right and left cardiophrenic angle, measuring about 10 mm diameter. These are suspicious for metastatic lesions. There is a large right pleural effusion with atelectasis or consolidation in the right lung base. Linear atelectasis throughout the aerated portion of the right lung. Left lung appears clear and expanded. Motion artifact limits evaluation of the lungs. No pneumothorax. The pleural effusion is increasing since previous study. CT ABDOMEN and PELVIS FINDINGS Complex cystic mass in the tail of the pancreas with extension into the splenic hilum. Low density enlarged lymph nodes demonstrated in the celiac axis and gastrohepatic ligament consistent with metastatic disease. Diffuse free fluid throughout the abdomen and pelvis consistent with ascites, possibly representing malignant ascites. Diffuse infiltration in the mesenteric may represent edema or peritoneal carcinomatosis. Ascites may be slightly more prominent than on prior study but overall the appearances are similar. Stomach, small bowel, and colon are not abnormally distended but there is evidence of small bowel wall thickening, probably reactive. Scattered stool in the colon. No free air. No focal liver lesions demonstrated. Mild gallbladder wall edema is likely reactive. No bile duct dilatation. 16 mm left adrenal gland nodule is probably metastatic. Kidneys are symmetrical without hydronephrosis or mass lesion. Aorta and IVC appear normal. Mild prominence of low-density lymph nodes  in the retroperitoneum, probably metastatic. Pelvis: Bladder is decompressed. Prostate gland is not enlarged. No evidence of diverticulitis. Appendix is not identified. No pelvic lymphadenopathy. Review of the MIP images confirms the above findings. IMPRESSION: No evidence of significant pulmonary embolus. New large right pleural effusion with basilar atelectasis. Probable metastatic lymph nodes in the right and left cardiophrenic angle. Multiloculated cystic mass in the tail of the pancreas and extending into the splenic hilum. Diffuse metastatic lymphadenopathy in the upper abdomen and retroperitoneum. 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 Diffuse ascites, demonstrating mild increase since prior study. Diffuse small bowel wall thickening is probably reactive. Electronically Signed   By: Lucienne Capers M.D.   On: 02/17/2016 01:02   Ct Abdomen Pelvis W Contrast  Result Date: 02/17/2016 CLINICAL DATA:  Tachycardia. Cough. Leg swelling. Abdominal pain and bloating. History of pancreatic cancer. EXAM: CT ANGIOGRAPHY CHEST CT ABDOMEN AND PELVIS WITH CONTRAST TECHNIQUE: Multidetector CT imaging of the chest was performed using the standard protocol during bolus administration of intravenous contrast. Multiplanar CT image reconstructions and MIPs were obtained to evaluate the vascular anatomy. Multidetector CT imaging of the abdomen and pelvis was performed using the standard protocol during bolus administration of intravenous contrast. CONTRAST:  100 mL Isovue 370 COMPARISON:  CT abdomen and pelvis 12/27/2015. CT chest abdomen and pelvis 11/27/2014. FINDINGS: CTA CHEST FINDINGS Technically adequate study with good opacification of the central and segmental pulmonary arteries.  No focal filling defects demonstrated. No evidence of significant pulmonary embolus. Normal heart size. Normal caliber thoracic aorta. No aortic dissection. Great vessel origins are patent. Streak artifact arising from contrast limits evaluation  of the mediastinum but no gross mediastinal lymphadenopathy is appreciated. There is a mildly enlarged lymph node in the right and left cardiophrenic angle, measuring about 10 mm diameter. These are suspicious for metastatic lesions. There is a large right pleural effusion with atelectasis or consolidation in the right lung base. Linear atelectasis throughout the aerated portion of the right lung. Left lung appears clear and expanded. Motion artifact limits evaluation of the lungs. No pneumothorax. The pleural effusion is increasing since previous study. CT ABDOMEN and PELVIS FINDINGS Complex cystic mass in the tail of the pancreas with extension into the splenic hilum. Low density enlarged lymph nodes demonstrated in the celiac axis and gastrohepatic ligament consistent with metastatic disease. Diffuse free fluid throughout the abdomen and pelvis consistent with ascites, possibly representing malignant ascites. Diffuse infiltration in the mesenteric may represent edema or peritoneal carcinomatosis. Ascites may be slightly more prominent than on prior study but overall the appearances are similar. Stomach, small bowel, and colon are not abnormally distended but there is evidence of small bowel wall thickening, probably reactive. Scattered stool in the colon. No free air. No focal liver lesions demonstrated. Mild gallbladder wall edema is likely reactive. No bile duct dilatation. 16 mm left adrenal gland nodule is probably metastatic. Kidneys are symmetrical without hydronephrosis or mass lesion. Aorta and IVC appear normal. Mild prominence of low-density lymph nodes in the retroperitoneum, probably metastatic. Pelvis: Bladder is decompressed. Prostate gland is not enlarged. No evidence of diverticulitis. Appendix is not identified. No pelvic lymphadenopathy. Review of the MIP images confirms the above findings. IMPRESSION: No evidence of significant pulmonary embolus. New large right pleural effusion with basilar  atelectasis. Probable metastatic lymph nodes in the right and left cardiophrenic angle. Multiloculated cystic mass in the tail of the pancreas and extending into the splenic hilum. Diffuse metastatic lymphadenopathy in the upper abdomen and retroperitoneum. 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 Diffuse ascites, demonstrating mild increase since prior study. Diffuse small bowel wall thickening is probably reactive. Electronically Signed   By: Lucienne Capers M.D.   On: 02/17/2016 01:02   US Paracentesis  Result Date: 02/17/2016 INDICATION: Pancreatic mass, recurrent ascites, right pleural effusion. Request made for diagnostic and therapeutic paracentesis. EXAM: ULTRASOUND GUIDED DIAGNOSTIC AND THERAPEUTIC PARACENTESIS MEDICATIONS: None. COMPLICATIONS: None immediate. PROCEDURE: Informed written consent was obtained from the patient after a discussion of the risks, benefits and alternatives to treatment. A timeout was performed prior to the initiation of the procedure. Initial ultrasound scanning demonstrates a small to moderate amount of ascites within the right lower abdominal quadrant. The right lower abdomen was prepped and draped in the usual sterile fashion. 1% lidocaine was used for local anesthesia. Following this, a Yueh catheter was introduced. An ultrasound image was saved for documentation purposes. The paracentesis was performed. The catheter was removed and a dressing was applied. The patient tolerated the procedure well without immediate post procedural complication. FINDINGS: A total of approximately 2.1 liters of turbid, yellow fluid was removed. Samples were sent to the laboratory as requested by the clinical team. IMPRESSION: Successful ultrasound-guided diagnostic and therapeutic paracentesis yielding 2.1 liters of peritoneal fluid. Read by: Rowe Robert, PA-C Electronically Signed   By: Aletta Edouard M.D.   On: 02/17/2016 13:42   Dg Chest Port 1v Same Day  Result Date: 02/18/2016 CLINICAL  DATA:  Shortness of breath and right-sided chest pain, recent thoracentesis EXAM: PORTABLE CHEST 1 VIEW COMPARISON:  02/18/2016 FINDINGS: Cardiac shadow is stable. The overall inspiratory effort is poor. No pneumothorax is seen. Right basilar atelectasis is noted which may be in part due to the poor inspiratory effort. Is small pleural effusion remains following thoracentesis. Some of the density in the right lung base may be related to some re-expansion edema. No bony abnormality is seen. IMPRESSION: Minimal right pleural effusion. Changes in the right base are noted consistent with a combination of atelectasis and possibly re-expansion edema. No pneumothorax is noted to correspond with the patient's chest pain. Electronically Signed   By: Inez Catalina M.D.   On: 02/18/2016 18:49   Dg Abd 2 Views  Result Date: 01/23/2016 CLINICAL DATA:  Nausea vomiting and generalized abdominal pain. EXAM: ABDOMEN - 2 VIEW COMPARISON:  CT of the abdomen and pelvis 12/27/2015 FINDINGS: The bowel gas pattern is nonobstructive, with clustering of small bowel loops in the center of the abdomen, consistent with presence of moderate amount of ascites. There is relative paucity of gas in the colon. No evidence of organomegaly. IMPRESSION: Nonobstructive bowel gas pattern. Moderate amount of abdominal ascites. Electronically Signed   By: Fidela Salisbury M.D.   On: 01/23/2016 21:26  US Thoracentesis Asp Pleural Space W/img Guide  Result Date: 02/18/2016 INDICATION: Pancreatic mass, ascites, bilateral pleural effusions right greater than left. Request made for diagnostic and therapeutic right thoracentesis. EXAM: ULTRASOUND GUIDED DIAGNOSTIC AND THERAPEUTIC RIGHT THORACENTESIS MEDICATIONS: None. COMPLICATIONS: None immediate. PROCEDURE: An ultrasound guided thoracentesis was thoroughly discussed with the patient and questions answered. The benefits, risks, alternatives and complications were also discussed. The patient  understands and wishes to proceed with the procedure. Written consent was obtained. Ultrasound was performed to localize and mark an adequate pocket of fluid in the right chest. The area was then prepped and draped in the normal sterile fashion. 1% Lidocaine was used for local anesthesia. Under ultrasound guidance a Safe-T-Centesis catheter was introduced. Thoracentesis was performed. The catheter was removed and a dressing applied. FINDINGS: A total of approximately 2 liters of turbid, amber fluid was removed. Samples were sent to the laboratory as requested by the clinical team. IMPRESSION: Successful ultrasound guided diagnostic and therapeutic right thoracentesis yielding 2 liters of pleural fluid. Read by: Rowe Robert, PA-C Electronically Signed   By: Jerilynn Mages.  Shick M.D.   On: 02/18/2016 10:16    Microbiology: Recent Results (from the past 240 hour(s))  Culture, body fluid-bottle     Status: None (Preliminary result)   Collection Time: 02/17/16  1:08 PM  Result Value Ref Range Status   Specimen Description FLUID PERITONEAL  Final   Special Requests NONE  Final   Culture   Final    NO GROWTH 4 DAYS Performed at [facility]    Report Status PENDING  Incomplete  Gram stain     Status: None   Collection Time: 02/17/16  1:08 PM  Result Value Ref Range Status   Specimen Description FLUID PERITONEAL  Final   Special Requests NONE  Final   Gram Stain   Final    FEW WBC PRESENT,BOTH PMN AND MONONUCLEAR NO ORGANISMS SEEN Performed at [facility]    Report Status 02/17/2016 FINAL  Final  Body fluid culture     Status: None   Collection Time: 02/18/16 10:23 AM  Result Value Ref Range Status   Specimen Description PLEURAL  Final   Special Requests  NONE  Final   Gram Stain   Final    FEW WBC PRESENT,BOTH PMN AND MONONUCLEAR NO ORGANISMS SEEN    Culture   Final    NO GROWTH 3 DAYS Performed at [facility]    Report Status 02/21/2016 FINAL  Final  Culture, blood  (Routine X 2) w Reflex to ID Panel     Status: None (Preliminary result)   Collection Time: 02/18/16  1:45 PM  Result Value Ref Range Status   Specimen Description BLOOD LEFT ARM  1 ML IN YELLOW  Final   Special Requests NONE  Final   Culture   Final    NO GROWTH 3 DAYS Performed at [facility]    Report Status PENDING  Incomplete     Labs: CBC:  Recent Labs Lab 02/16/16 2300 02/18/16 0525  WBC 9.3 10.1  NEUTROABS 7.1  --   HGB 12.4* 12.7*  HCT 34.9* 35.6*  MCV 82.9 83.2  PLT 260 [id number]   Basic Metabolic Panel:  Recent Labs Lab 02/16/16 2300 02/18/16 0525  NA 130* 130*  K 3.8 3.7  CL 99* 100*  CO2 23 21*  GLUCOSE 93 81  BUN 7 7  CREATININE 0.73 0.59*  CALCIUM 7.7* 7.6*   Liver Function Tests:  Recent Labs Lab 02/16/16 2300  AST 179*  ALT 68*  ALKPHOS 330*  BILITOT 3.8*  PROT 6.4*  ALBUMIN 2.0*    Recent Labs Lab 02/16/16 2300  LIPASE 55*   No results for input(s): AMMONIA in the last 168 hours. Cardiac Enzymes: No results for input(s): CKTOTAL, CKMB, CKMBINDEX, TROPONINI in the last 168 hours. BNP (last 3 results) No results for input(s): BNP in the last 8760 hours. CBG:  Recent Labs Lab 02/16/16 2251 02/17/16 0002  GLUCAP 95 78   Time spent: 30 minutes  Signed:  Taseen Marasigan  [facility] 02/19/2016 , 6:34 PM

## 2016-02-22 LAB — CULTURE, BODY FLUID-BOTTLE: CULTURE: NO GROWTH

## 2016-02-22 LAB — CULTURE, BODY FLUID W GRAM STAIN -BOTTLE

## 2016-02-23 LAB — CULTURE, BLOOD (ROUTINE X 2): CULTURE: NO GROWTH

## 2016-02-28 ENCOUNTER — Encounter (HOSPITAL_COMMUNITY): Payer: Self-pay | Admitting: Emergency Medicine

## 2016-02-28 ENCOUNTER — Emergency Department (HOSPITAL_COMMUNITY)
Admission: EM | Admit: 2016-02-28 | Discharge: 2016-02-29 | Disposition: A | Discharge status: Home / Self Care | Payer: BLUE CROSS/BLUE SHIELD | Attending: Emergency Medicine | Admitting: Emergency Medicine

## 2016-02-28 DIAGNOSIS — Z79899 Other long term (current) drug therapy: Secondary | ICD-10-CM | POA: Insufficient documentation

## 2016-02-28 DIAGNOSIS — C259 Malignant neoplasm of pancreas, unspecified: Secondary | ICD-10-CM | POA: Diagnosis not present

## 2016-02-28 DIAGNOSIS — R1084 Generalized abdominal pain: Secondary | ICD-10-CM | POA: Diagnosis present

## 2016-02-28 DIAGNOSIS — I10 Essential (primary) hypertension: Secondary | ICD-10-CM | POA: Insufficient documentation

## 2016-02-28 DIAGNOSIS — Z87891 Personal history of nicotine dependence: Secondary | ICD-10-CM | POA: Diagnosis not present

## 2016-02-28 DIAGNOSIS — R188 Other ascites: Secondary | ICD-10-CM

## 2016-02-28 LAB — CBC WITH DIFFERENTIAL/PLATELET
BASOS ABS: 0.1 10*3/uL (ref 0.0–0.1)
Basophils Relative: 1 %
EOS PCT: 1 %
Eosinophils Absolute: 0.1 10*3/uL (ref 0.0–0.7)
HEMATOCRIT: 33.7 % — AB (ref 39.0–52.0)
HEMOGLOBIN: 12.3 g/dL — AB (ref 13.0–17.0)
LYMPHS PCT: 6 %
Lymphs Abs: 0.6 10*3/uL — ABNORMAL LOW (ref 0.7–4.0)
MCH: 30.4 pg (ref 26.0–34.0)
MCHC: 36.5 g/dL — ABNORMAL HIGH (ref 30.0–36.0)
MCV: 83.4 fL (ref 78.0–100.0)
MONOS PCT: 11 %
Monocytes Absolute: 1.2 10*3/uL — ABNORMAL HIGH (ref 0.1–1.0)
NEUTROS PCT: 81 %
Neutro Abs: 8.5 10*3/uL — ABNORMAL HIGH (ref 1.7–7.7)
Platelets: 135 10*3/uL — ABNORMAL LOW (ref 150–400)
RBC: 4.04 MIL/uL — AB (ref 4.22–5.81)
RDW: 20.5 % — ABNORMAL HIGH (ref 11.5–15.5)
WBC: 10.5 10*3/uL (ref 4.0–10.5)

## 2016-02-28 LAB — COMPREHENSIVE METABOLIC PANEL
ALBUMIN: 1.6 g/dL — AB (ref 3.5–5.0)
ALK PHOS: 321 U/L — AB (ref 38–126)
ALT: 66 U/L — ABNORMAL HIGH (ref 17–63)
ANION GAP: 9 (ref 5–15)
AST: 216 U/L — AB (ref 15–41)
BILIRUBIN TOTAL: 6.9 mg/dL — AB (ref 0.3–1.2)
BUN: 8 mg/dL (ref 6–20)
CALCIUM: 7.3 mg/dL — AB (ref 8.9–10.3)
CO2: 20 mmol/L — AB (ref 22–32)
Chloride: 95 mmol/L — ABNORMAL LOW (ref 101–111)
Creatinine, Ser: 0.37 mg/dL — ABNORMAL LOW (ref 0.61–1.24)
GFR calc Af Amer: >60 mL/min (ref >60)
GFR calc non Af Amer: >60 mL/min (ref >60)
GLUCOSE: 122 mg/dL — AB (ref 65–99)
Potassium: 4.9 mmol/L (ref 3.5–5.1)
SODIUM: 124 mmol/L — AB (ref 135–145)
TOTAL PROTEIN: 5.4 g/dL — AB (ref 6.5–8.1)

## 2016-02-28 MED ORDER — ALBUMIN HUMAN 25 % IV SOLN
12.5000 g | Freq: Once | INTRAVENOUS | Status: DC
  Filled 2016-02-28: qty 50

## 2016-02-28 NOTE — Progress Notes (Addendum)
Patient recently discharged from hospital from 08/17 to 08/20.   Patient met with [facility] rep. EDCM placed call to [facility] to see if patient has been admitted to their services.  Awaiting call back. EDCM received call back from [facility] on call RN for [place].  She confirms patient is active with [place]. Patient last seen by [facility] on 08/28.  She reports patient's wife has taken off one month from work to assist in his care at home.  She reports patient is seen by Dr. Teena Irani of [facility].  Patient does not have pcp listed.  She reports patient is from [place] and has only been here for 1.5 years.  Patient listed as being very religious member of [employer] [employer] church.  Patient does not have insurance.  Per hospice nursing notes, patient's family must work out payment plan with hospital for paracentesis.  [facility] does not pay for paracentesis.  Hospice nursing notes also reveal patient is currently waiting for medicinal herbs from [place] per [place].  Benjamine Mola reports she will ask that patient is followed up with tomorrow by [facility].  Elizabeth of [place] requests call back to let her know if patient is admitted of discharged this evening. [phone].

## 2016-02-28 NOTE — ED Triage Notes (Signed)
Patient here from home via EMS with complaints of abdominal distention. Hx of pancreatic cancer. Denies n/v/d. Bilateral lower extremity swelling. Tachycardia.

## 2016-02-28 NOTE — ED Provider Notes (Signed)
[facility] DEPT Provider Note   CSN: [id number] Arrival date & time: 02/28/16  1945  By signing my name below, I, Keith Hart, attest that this documentation has been prepared under the direction and in the presence of non-physician practitioner, Antonietta Breach, PA-C. Electronically Signed: Evelene Hart, Scribe. 02/28/2016. 8:35 PM.  History   Chief Complaint Chief Complaint  Patient presents with  . Abdominal Pain  . Fatigue  . Edema    The history is provided by the patient and a relative. No language interpreter was used.   HPI Comments:  Keith Hart is a 45 y.o. male with a history of pancreatic cancer with metastasis to the liver, spleen, and surrounding lymph nodes and ascites, who presents to the Emergency Department complaining of diffuse abdominal pain and distension x ~2 weeks. Pt has been seen in the ED twice since July 2017 for the same and admitted to the hospital. He has had a paracentesis to his abdomen on both occasions with relief during the July hospitalization, however, family notes during the last visit in August 2017 very little fluid was drawn and he was discharged with very little improvement. Family believes he is in need of another paracentesis. Family notes poor appetite and BLE swelling; denies SOB and fever. Pt is not currently undergoing treatment for his CA. Pt is not a native [place] speaker, hx translated by family.    Past Medical History:  Diagnosis Date  . Daily headache   . Gastritis   . GERD (gastroesophageal reflux disease)   . Hypertension   . Migraine    "alot; due to coughing" (12/28/2015)  . Pancreatic mass 12/28/2015  . Renal disorder    Pt reports Kidney problems intermittenlty. Unknown specifics    Patient Active Problem List   Diagnosis Date Noted  . Pleural effusion on right 02/17/2016  . SIRS (systemic inflammatory response syndrome) ([place]) 01/24/2016  . Abdominal pain 01/24/2016  . Right upper quadrant pain   .  Palliative care encounter   . Goals of care, counseling/discussion   . DNR (do not resuscitate)   . Pancreatic mass 12/27/2015  . Ascites 12/27/2015  . Hemoptysis 08/18/2014  . HTN (hypertension) 08/04/2014  . Establishing care with new doctor, encounter for 08/04/2014  . Urinary tract infectious disease 08/04/2014    Past Surgical History:  Procedure Laterality Date  . NO PAST SURGERIES       Home Medications    Prior to Admission medications   Medication Sig Start Date End Date Taking? Authorizing Provider  feeding supplement, ENSURE ENLIVE, (ENSURE ENLIVE) LIQD Take 237 mLs by mouth 2 (two) times daily between meals. Patient not taking: Reported on 02/16/2016 12/29/15   Florencia Reasons, MD  furosemide (LASIX) 40 MG tablet Take 1 tablet (40 mg total) by mouth daily. Patient not taking: Reported on 02/16/2016 12/29/15   Florencia Reasons, MD  ondansetron (ZOFRAN) 4 MG tablet Take 1 tablet (4 mg total) by mouth every 8 (eight) hours as needed for nausea or vomiting. 02/19/16   Lavina Hamman, MD  oxyCODONE (OXY IR/ROXICODONE) 5 MG immediate release tablet Take 1 tablet (5 mg total) by mouth every 6 (six) hours as needed for severe pain. 02/19/16   Lavina Hamman, MD  potassium chloride (K-DUR) 10 MEQ tablet Take 1 tablet (10 mEq total) by mouth daily. Patient not taking: Reported on 02/16/2016 01/24/16   Bonnell Public, MD  propranolol (INDERAL) 10 MG tablet Take 1 tablet (10 mg total) by mouth 3 (  three) times daily. Patient not taking: Reported on 02/16/2016 12/29/15   Florencia Reasons, MD  spironolactone (ALDACTONE) 100 MG tablet Take 1 tablet (100 mg total) by mouth daily. Patient not taking: Reported on 02/16/2016 12/29/15   Florencia Reasons, MD  traZODone (DESYREL) 50 MG tablet Take 1 tablet (50 mg total) by mouth at bedtime as needed for sleep. 02/19/16   Lavina Hamman, MD    Family History Family History  Problem Relation Age of Onset  . Hypertension Mother     Social History Social History  Substance Use  Topics  . Smoking status: Former Smoker    Packs/day: 0.50    Years: 6.00    Types: Cigarettes  . Smokeless tobacco: Never Used     Comment: "quit smoking in ~ 1995  . Alcohol use 12.0 oz/week    20 Cans of beer per week     Comment: 12/28/2015 "3, 40oz beers, 2 days/week"     Allergies   Other; Pork-derived products; and Eggs or egg-derived products   Review of Systems Review of Systems  10 systems reviewed and all are negative for acute change except as noted in the HPI.   Physical Exam Updated Vital Signs BP 144/84 (BP Location: Right Arm)   Pulse (!) 124   Temp 98.4 F (36.9 C) (Oral)   Resp (!) 27   SpO2 96%   Physical Exam  Constitutional: He is oriented to person, place, and time. He appears well-developed and well-nourished. No distress.  Cachexia noted. Patient in NAD. Calm and cooperative.  HENT:  Head: Normocephalic and atraumatic.  Eyes: Conjunctivae and EOM are normal. Scleral icterus is present.  Neck: Normal range of motion.  Cardiovascular:  Tachycardia present  Pulmonary/Chest: Effort normal. No respiratory distress. He has no wheezes.  Mild tachypnea without respiratory distress. No hypoxia.  Abdominal: Soft. He exhibits distension and ascites. He exhibits no mass. There is generalized tenderness (generalized).  Musculoskeletal: Normal range of motion.  Neurological: He is alert and oriented to person, place, and time.  GCS 15. Patient moving all extremities.  Skin: Skin is warm and dry. No rash noted. He is not diaphoretic. No erythema. No pallor.  Psychiatric: He has a normal mood and affect. His behavior is normal.  Nursing note and vitals reviewed.    ED Treatments / Results  DIAGNOSTIC STUDIES:  Oxygen Saturation is 99% on RA, normal by my interpretation.    COORDINATION OF CARE:  8:21 PM Explained to pt and family details of DNR paperwork, pt states he would like to be DNR and just wants to be made comfortable. Treatment plan also  discussed with pt and family at bedside and they agreed to plan.  Labs (all labs ordered are listed, but only abnormal results are displayed) Labs Reviewed  CBC WITH DIFFERENTIAL/PLATELET - Abnormal; Notable for the following:       Result Value   RBC 4.04 (*)    Hemoglobin 12.3 (*)    HCT 33.7 (*)    MCHC 36.5 (*)    RDW 20.5 (*)    Platelets 135 (*)    Neutro Abs 8.5 (*)    Lymphs Abs 0.6 (*)    Monocytes Absolute 1.2 (*)    All other components within normal limits  COMPREHENSIVE METABOLIC PANEL - Abnormal; Notable for the following:    Sodium 124 (*)    Chloride 95 (*)    CO2 20 (*)    Glucose, Bld 122 (*)  Creatinine, Ser 0.37 (*)    Calcium 7.3 (*)    Total Protein 5.4 (*)    Albumin 1.6 (*)    AST 216 (*)    ALT 66 (*)    Alkaline Phosphatase 321 (*)    Total Bilirubin 6.9 (*)    All other components within normal limits  BODY FLUID CELL COUNT WITH DIFFERENTIAL - Abnormal; Notable for the following:    Appearance, Fluid HAZY (*)    Neutrophil Count, Fluid 26 (*)    Monocyte-Macrophage-Serous Fluid 21 (*)    All other components within normal limits  LACTATE DEHYDROGENASE, BODY FLUID - Abnormal; Notable for the following:    LD, Fluid 123 (*)    All other components within normal limits  CULTURE, BODY FLUID-BOTTLE  GRAM STAIN  BODY FLUID CULTURE  GLUCOSE, SEROUS FLUID  PROTEIN, BODY FLUID  AMYLASE, PERITONEAL FLUID  ALBUMIN, FLUID  PATHOLOGIST SMEAR REVIEW    EKG  EKG Interpretation None       Radiology No results found.  Procedures .Paracentesis Date/Time: 02/28/2016 10:20 PM Performed by: Antonietta Breach Authorized by: Antonietta Breach   Consent:    Consent obtained:  Verbal   Consent given by:  Patient   Risks discussed:  Bleeding, bowel perforation, infection and pain Pre-procedure details:    Procedure purpose:  Therapeutic   Preparation: Patient was prepped and draped in usual sterile fashion   Anesthesia (see MAR for exact dosages):     Anesthesia method:  Local infiltration   Local anesthetic:  Lidocaine 2% w/o epi Procedure details:    Ultrasound guidance: yes     Puncture site:  R lower quadrant   Fluid removed amount:  1.3L   Fluid appearance:  Clear and yellow   Dressing:  Adhesive bandage Post-procedure details:    Patient tolerance of procedure:  Tolerated well, no immediate complications Comments:     Therapeutic paracentesis for management of end stage pancreatic cancer with secondary ascites. Patient tolerated well with no immediate complications.    (including critical care time)  Medications Ordered in ED Medications - No data to display   Initial Impression / Assessment and Plan / ED Course  I have reviewed the triage vital signs and the nursing notes.  Pertinent labs & imaging results that were available during my care of the patient were reviewed by me and considered in my medical decision making (see chart for details).  Clinical Course    45 year old male presents to the emergency department for complaints of abdominal pain. He has known end-stage pancreatic cancer with metastasis to the liver, spleen, and lymph nodes. He has declined all cancer treatments. Cancer known to be causing abdominal ascites and, more recently, pleural effusion. Patient had pleural effusions drained during recent admission. He is requesting paracentesis for comfort as the distention of his abdomen from his ascites is causing him to feel more weak with less of an appetite.  The patient is followed by hospice. He does not desire admission. Patient and family understand the nature of his fatal diagnosis. Family states that their only hope is for him to be more comfortable, which was the underlying reason for the patient's presentation today. Therapeutic paracentesis performed at bedside without immediate complications. Patient tolerated the procedure well. Patient expresses gratitude for the procedure performed. No albumin given  as <3L drained from abdomen.  Patient is afebrile. He has no leukocytosis and vital signs are stable. Doubt SBP. I believe he is stable for discharge. He is  to follow up with his current doctors. Patient told to return for any new or concerning symptoms. He was discharged from the department in satisfactory condition.    Final Clinical Impressions(s) / ED Diagnoses   Final diagnoses:  Ascites  Malignant neoplasm of pancreas, unspecified location of malignancy [facility])    New Prescriptions Discharge Medication List as of 02/28/2016 11:35 PM        Antonietta Breach, PA-C 02/29/16 [id number]    Davonna Belling, MD 02/29/16 [phone]

## 2016-02-28 NOTE — Progress Notes (Signed)
EDCM confirmed with ED registration that patient has [employer].

## 2016-02-28 NOTE — ED Notes (Signed)
Bed: WA02 Expected date:  Expected time:  Means of arrival:  Comments: 45 yr old acsites

## 2016-02-29 LAB — AMYLASE, PERITONEAL FLUID: AMYLASE, PERITONEAL FLUID: 19 U/L

## 2016-02-29 LAB — BODY FLUID CELL COUNT WITH DIFFERENTIAL
EOS FL: 0 %
Lymphs, Fluid: 53 %
MONOCYTE-MACROPHAGE-SEROUS FLUID: 21 % — AB (ref 50–90)
NEUTROPHIL FLUID: 26 % — AB (ref 0–25)
WBC FLUID: 365 uL (ref 0–1000)

## 2016-02-29 LAB — GRAM STAIN

## 2016-02-29 LAB — PROTEIN, BODY FLUID: Total protein, fluid: <3 g/dL

## 2016-02-29 LAB — GLUCOSE, SEROUS FLUID: GLUCOSE FL: 96 mg/dL

## 2016-02-29 LAB — PATHOLOGIST SMEAR REVIEW

## 2016-02-29 LAB — LACTATE DEHYDROGENASE, PLEURAL OR PERITONEAL FLUID: LD, Fluid: 123 U/L — ABNORMAL HIGH (ref 3–23)

## 2016-02-29 LAB — ALBUMIN, FLUID (OTHER)

## 2016-02-29 NOTE — Consult Note (Signed)
Consultation Note Date: 02/29/2016   Patient Name: Keith Hart  DOB: 1970-08-02  MRN: [id number]  Age / Sex: 45 y.o., male  PCP: No Pcp Per Patient Referring Physician: No att. providers found  Reason for Consultation: Disposition and Establishing goals of care  HPI/Patient Profile: 45 y.o. male  with past medical history of recently diagnosed pancreatic mass with metastasis concerning for pancreatic cancer admitted on 02/16/2016 and s/p paracentesis and thoracentesis.  Has had continued decline in functional status and palliative consulted for goals of care/conversation regarding discharge with hospice.   Clinical Assessment and Goals of Care: Met today with patient and his friend who also serves as Optometrist. He declines to use other translator services.  He reports the most important thing to him are family, doing as well as possible as long as possible, and being at home.  We discussed his clinical course as well as pathways moving forward including transition home with the support of hospice.  SUMMARY OF RECOMMENDATIONS   He wants to discharge home with hospice support.  Hospice will admit (likely tomorrow) and can work as outpatient to further titrate medications  Code Status/Advance Care Planning:  DNR    Symptom Management:  On discharge, recommend scripts for:  Oxycodone 54m every 3 hours as needed for pain. Trazodone as needed at night for sleep. Zofran as needed.  Palliative Prophylaxis:   Bowel Regimen and Frequent Pain Assessment  Additional Recommendations (Limitations, Scope, Preferences):  Avoid Hospitalization  Psycho-social/Spiritual:   Desire for further Chaplaincy support:no  Additional Recommendations: Caregiving  Support/Resources  Prognosis:   < 6 months  Discharge Planning: Home with Hospice      Primary Diagnoses: Present on Admission: .  Abdominal pain . Pancreatic mass . Ascites . Pleural effusion on right   I have reviewed the medical record, interviewed the patient and family, and examined the patient. The following aspects are pertinent.  Past Medical History:  Diagnosis Date  . Daily headache   . Gastritis   . GERD (gastroesophageal reflux disease)   . Hypertension   . Migraine    "alot; due to coughing" (12/28/2015)  . Pancreatic mass 12/28/2015  . Renal disorder    Pt reports Kidney problems intermittenlty. Unknown specifics   Social History   Social History  . Marital status: Married    Spouse name: Gethte  . Number of children: N/A  . Years of education: N/A   Social History Main Topics  . Smoking status: Former Smoker    Packs/day: 0.50    Years: 6.00    Types: Cigarettes  . Smokeless tobacco: Never Used     Comment: "quit smoking in ~ 1995  . Alcohol use 12.0 oz/week    20 Cans of beer per week     Comment: 12/28/2015 "3, 40oz beers, 2 days/week"  . Drug use: No  . Sexual activity: No   Other Topics Concern  . 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 Hypertension Mother    Scheduled Meds: Continuous Infusions: PRN Meds:. Medications Prior to Admission:  Prior to Admission medications   Medication Sig Start Date End Date Taking? Authorizing Provider  feeding supplement, ENSURE ENLIVE, (ENSURE ENLIVE) LIQD Take 237 mLs by mouth 2 (two) times daily between meals. Patient not taking: Reported on 02/16/2016 12/29/15   Florencia Reasons, MD  furosemide (LASIX) 40 MG tablet Take 1 tablet (40 mg total) by mouth daily. Patient not taking: Reported on 02/16/2016 12/29/15   Florencia Reasons, MD  ondansetron (ZOFRAN) 4 MG tablet Take 1 tablet (4 mg total) by mouth every 8 (eight) hours as needed for nausea or vomiting. 02/19/16   Lavina Hamman, MD  oxyCODONE (OXY IR/ROXICODONE) 5 MG immediate release tablet Take 1 tablet (5 mg  total) by mouth every 6 (six) hours as needed for severe pain. 02/19/16   Lavina Hamman, MD  potassium chloride (K-DUR) 10 MEQ tablet Take 1 tablet (10 mEq total) by mouth daily. Patient not taking: Reported on 02/16/2016 01/24/16   Bonnell Public, MD  propranolol (INDERAL) 10 MG tablet Take 1 tablet (10 mg total) by mouth 3 (three) times daily. Patient not taking: Reported on 02/16/2016 12/29/15   Florencia Reasons, MD  spironolactone (ALDACTONE) 100 MG tablet Take 1 tablet (100 mg total) by mouth daily. Patient not taking: Reported on 02/16/2016 12/29/15   Florencia Reasons, MD  traZODone (DESYREL) 50 MG tablet Take 1 tablet (50 mg total) by mouth at bedtime as needed for sleep. 02/19/16   Lavina Hamman, MD   Allergies  Allergen Reactions  . Other Other (See Comments)    Patient does not eat [place] per religious preference and it will make patient sick.  . Pork-Derived Products Other (See Comments)    Per religious preference and will make patient sick.  . Eggs Or Egg-Derived Products Nausea Only   Review of Systems  Constitutional: Positive for activity change, appetite change, fatigue and unexpected weight change.  Gastrointestinal: Positive for abdominal pain.  Musculoskeletal: Positive for back pain and gait problem.  Neurological: Positive for weakness.  Psychiatric/Behavioral: Positive for sleep disturbance.    Physical Exam General: Alert, Awake and Oriented to Time, Place and Person. Appear in mild distress due to pain Eyes: PERRL, Conjunctiva normal ENT: Oral Mucosa clear moist. Neck: no JVD, no Abnormal Mass Or lumps Cardiovascular: S1 and S2 Present, no Murmur, Respiratory: Bilateral Air entry equal and Decreased, Clear to Auscultation, no Crackles, no wheezes Abdomen: Bowel Sound present, Soft and no tenderness Skin: no redness, no Rash  Extremities: no Pedal edema, no calf tenderness  Vital Signs: BP 131/80 (BP Location: Right Arm)   Pulse (!) 132   Temp 99.2 F (37.3 C) (Axillary)    Resp 20   Ht _0  (1.702 m)   Wt 65.3 kg (144 lb)   SpO2 97%   BMI 22.55 kg/m  Pain Assessment: 0-10   Pain Score: 2    SpO2: SpO2: 97 % O2 Device:SpO2: 97 % O2 Flow Rate: .   IO: Intake/output summary: No intake or output data in the 24 hours ending 02/29/16 1827  LBM:   Baseline Weight: Weight: 68.9 kg (151 lb 14.4 oz) Most recent weight: Weight: 65.3 kg (144 lb)     Palliative Assessment/Data: 40%   Flowsheet Rows   Flowsheet Row Most Recent Value  Intake Tab  Referral Department  Critical care  Unit at Time  of Referral  Med/Surg Unit  Palliative Care Primary Diagnosis  Cancer  Date Notified  02/18/16  Palliative Care Type  New Palliative care  Reason for referral  Clarify Goals of Care, Counsel Regarding Hospice  Date of Admission  02/16/16  Date first seen by Palliative Care  02/19/16  # of days Palliative referral response time  1 Day(s)  # of days IP prior to Palliative referral  2  Clinical Assessment  Psychosocial & Spiritual Assessment  Palliative Care Outcomes  Actual Discharge Date  02/19/16 [home with hospice]      Time In: 1100 Time Out: 1145 Time Total: 45 Greater than 50%  of this time was spent counseling and coordinating care related to the above assessment and plan.  Signed by: Micheline Rough, MD   Please contact Palliative Medicine Team phone at [phone] for questions and concerns.  For individual provider: See Shea Evans

## 2016-03-05 LAB — CULTURE, BODY FLUID-BOTTLE

## 2016-03-05 LAB — CULTURE, BODY FLUID W GRAM STAIN -BOTTLE: Culture: NO GROWTH

## 2016-03-15 ENCOUNTER — Encounter (HOSPITAL_COMMUNITY): Payer: Self-pay | Admitting: Emergency Medicine

## 2016-03-15 ENCOUNTER — Emergency Department (HOSPITAL_COMMUNITY)
Admission: EM | Admit: 2016-03-15 | Discharge: 2016-03-16 | Disposition: A | Discharge status: Home / Self Care | Payer: BLUE CROSS/BLUE SHIELD | Attending: Internal Medicine | Admitting: Internal Medicine

## 2016-03-15 ENCOUNTER — Emergency Department (HOSPITAL_COMMUNITY): Payer: BLUE CROSS/BLUE SHIELD

## 2016-03-15 DIAGNOSIS — M6281 Muscle weakness (generalized): Secondary | ICD-10-CM | POA: Diagnosis not present

## 2016-03-15 DIAGNOSIS — I10 Essential (primary) hypertension: Secondary | ICD-10-CM | POA: Diagnosis not present

## 2016-03-15 DIAGNOSIS — Z87891 Personal history of nicotine dependence: Secondary | ICD-10-CM | POA: Diagnosis not present

## 2016-03-15 DIAGNOSIS — R0602 Shortness of breath: Secondary | ICD-10-CM

## 2016-03-15 DIAGNOSIS — Z79899 Other long term (current) drug therapy: Secondary | ICD-10-CM | POA: Diagnosis not present

## 2016-03-15 DIAGNOSIS — R627 Adult failure to thrive: Secondary | ICD-10-CM | POA: Diagnosis not present

## 2016-03-15 DIAGNOSIS — L03311 Cellulitis of abdominal wall: Secondary | ICD-10-CM | POA: Diagnosis not present

## 2016-03-15 DIAGNOSIS — R531 Weakness: Secondary | ICD-10-CM

## 2016-03-15 LAB — CBC WITH DIFFERENTIAL/PLATELET
BASOS ABS: 0 10*3/uL (ref 0.0–0.1)
Basophils Relative: 0 %
Eosinophils Absolute: 0 10*3/uL (ref 0.0–0.7)
Eosinophils Relative: 0 %
HCT: 34.4 % — ABNORMAL LOW (ref 39.0–52.0)
HEMOGLOBIN: 12.5 g/dL — AB (ref 13.0–17.0)
LYMPHS ABS: 0.3 10*3/uL — AB (ref 0.7–4.0)
Lymphocytes Relative: 2 %
MCH: 31.6 pg (ref 26.0–34.0)
MCHC: 36.3 g/dL — AB (ref 30.0–36.0)
MCV: 87.1 fL (ref 78.0–100.0)
MONO ABS: 1 10*3/uL (ref 0.1–1.0)
MONOS PCT: 7 %
NEUTROS ABS: 12.3 10*3/uL — AB (ref 1.7–7.7)
Neutrophils Relative %: 91 %
PLATELETS: 180 10*3/uL (ref 150–400)
RBC: 3.95 MIL/uL — ABNORMAL LOW (ref 4.22–5.81)
RDW: 21.7 % — AB (ref 11.5–15.5)
WBC: 13.6 10*3/uL — ABNORMAL HIGH (ref 4.0–10.5)

## 2016-03-15 LAB — COMPREHENSIVE METABOLIC PANEL
ALBUMIN: 1.3 g/dL — AB (ref 3.5–5.0)
ALK PHOS: 238 U/L — AB (ref 38–126)
ALT: 52 U/L (ref 17–63)
AST: 130 U/L — AB (ref 15–41)
Anion gap: 10 (ref 5–15)
BILIRUBIN TOTAL: 5.6 mg/dL — AB (ref 0.3–1.2)
BUN: 19 mg/dL (ref 6–20)
CALCIUM: 7.1 mg/dL — AB (ref 8.9–10.3)
CO2: 21 mmol/L — ABNORMAL LOW (ref 22–32)
CREATININE: 0.7 mg/dL (ref 0.61–1.24)
Chloride: 92 mmol/L — ABNORMAL LOW (ref 101–111)
GFR calc Af Amer: >60 mL/min (ref >60)
GLUCOSE: 82 mg/dL (ref 65–99)
POTASSIUM: 4 mmol/L (ref 3.5–5.1)
Sodium: 123 mmol/L — ABNORMAL LOW (ref 135–145)
TOTAL PROTEIN: 5.4 g/dL — AB (ref 6.5–8.1)

## 2016-03-15 MED ORDER — LIDOCAINE HCL (PF) 1 % IJ SOLN
30.0000 mL | Freq: Once | INTRAMUSCULAR | Status: DC
  Filled 2016-03-15: qty 30

## 2016-03-15 MED ORDER — SODIUM CHLORIDE 0.9 % IV BOLUS (SEPSIS)
1000.0000 mL | Freq: Once | INTRAVENOUS | Status: AC
  Administered 2016-03-15: 1000 mL via INTRAVENOUS

## 2016-03-15 NOTE — ED Provider Notes (Signed)
[facility] DEPT Provider Note   CSN: [id number] Arrival date & time: 03/15/16  1947  By signing my name below, I, Gwenlyn Fudge, attest that this documentation has been prepared under the direction and in the presence of [place], [place]. Electronically Signed: Gwenlyn Fudge, ED Scribe. 03/15/16. 8:26 PM.   History   Chief Complaint Chief Complaint  Patient presents with  . Shortness of Breath   The history is provided by the spouse. The history is limited by a language barrier. A language interpreter was used.    HPI Comments:  Keith Hart is a 45 y.o. male with PMHx of GERD, HTN, and pancreatic cancer with metastasis to the liver, spleen and surrounding lymph nodes, who presents to the Emergency Department complaining of gradual onset and worsening generalized weakness, difficulty breathing, and abdominal distension for the past three weeks. Patient is very weak, nonverbal, and confused at times and thus history is obtained from his wife with the assistance of a translator. Pt's wife reports he is a hospice patient and DNR (they do not have the DNR paperwork with them). Pt's wife states the swelling in pt's abdomen is getting to a point that he cannot breathe and they came tonight for [place] to draw liquid out to make him more comfortable. Pt was seen 02/28/2016 with similar symptoms and therapeutic paracentesis was performed at that time. The pt's wife states that his swelling has returned since he was last seen. She reports over the past few weeks pt has had a decreased appetite, fever, confusion, and shortness of breath. There is an area of erythema on his abdomen that has been present for two weeks. His wife states at home he is often confused and at times his speech is unintelligible or he inappropriately answers her questions.   Past Medical History:  Diagnosis Date  . Daily headache   . Gastritis   . GERD (gastroesophageal reflux disease)   . Hypertension   . Migraine    "alot; due to coughing" (12/28/2015)  . Pancreatic mass 12/28/2015  . Renal disorder    Pt reports Kidney problems intermittenlty. Unknown specifics    Patient Active Problem List   Diagnosis Date Noted  . Pleural effusion on right 02/17/2016  . SIRS (systemic inflammatory response syndrome) ([place]) 01/24/2016  . Abdominal pain 01/24/2016  . Right upper quadrant pain   . Palliative care encounter   . Goals of care, counseling/discussion   . DNR (do not resuscitate)   . Pancreatic mass 12/27/2015  . Ascites 12/27/2015  . Hemoptysis 08/18/2014  . HTN (hypertension) 08/04/2014  . Establishing care with new doctor, encounter for 08/04/2014  . Urinary tract infectious disease 08/04/2014    Past Surgical History:  Procedure Laterality Date  . NO PAST SURGERIES       Home Medications    Prior to Admission medications   Medication Sig Start Date End Date Taking? Authorizing Provider  feeding supplement, ENSURE ENLIVE, (ENSURE ENLIVE) LIQD Take 237 mLs by mouth 2 (two) times daily between meals. Patient not taking: Reported on 02/16/2016 12/29/15   Florencia Reasons, MD  furosemide (LASIX) 40 MG tablet Take 1 tablet (40 mg total) by mouth daily. Patient not taking: Reported on 02/16/2016 12/29/15   Florencia Reasons, MD  ondansetron (ZOFRAN) 4 MG tablet Take 1 tablet (4 mg total) by mouth every 8 (eight) hours as needed for nausea or vomiting. 02/19/16   Lavina Hamman, MD  oxyCODONE (OXY IR/ROXICODONE) 5 MG immediate release tablet Take  1 tablet (5 mg total) by mouth every 6 (six) hours as needed for severe pain. 02/19/16   Lavina Hamman, MD  potassium chloride (K-DUR) 10 MEQ tablet Take 1 tablet (10 mEq total) by mouth daily. Patient not taking: Reported on 02/16/2016 01/24/16   Bonnell Public, MD  propranolol (INDERAL) 10 MG tablet Take 1 tablet (10 mg total) by mouth 3 (three) times daily. Patient not taking: Reported on 02/16/2016 12/29/15   Florencia Reasons, MD  spironolactone (ALDACTONE) 100 MG tablet Take 1  tablet (100 mg total) by mouth daily. Patient not taking: Reported on 02/16/2016 12/29/15   Florencia Reasons, MD  traZODone (DESYREL) 50 MG tablet Take 1 tablet (50 mg total) by mouth at bedtime as needed for sleep. 02/19/16   Lavina Hamman, MD    Family History Family History  Problem Relation Age of Onset  . Hypertension Mother     Social History Social History  Substance Use Topics  . Smoking status: Former Smoker    Packs/day: 0.50    Years: 6.00    Types: Cigarettes  . Smokeless tobacco: Never Used     Comment: "quit smoking in ~ 1995  . Alcohol use 12.0 oz/week    20 Cans of beer per week     Comment: 12/28/2015 "3, 40oz beers, 2 days/week"     Allergies   Other; Pork-derived products; and Eggs or egg-derived products   Review of Systems Review of Systems  Unable to perform ROS: Patient nonverbal (pt weak, laying in bed, not answering questions)   Physical Exam Updated Vital Signs BP 118/81 (BP Location: Left Arm)   Pulse 118   Temp 97.5 F (36.4 C) (Axillary)   Resp 22   SpO2 93%   Physical Exam  Constitutional: He is oriented to person, place, and time.  Pt ill appearing, cachectic, laying in bed very uncomfortable appearing. Eyes closed. Does not speak. Does not respond to questions or commands  HENT:  MM dry  Eyes: Scleral icterus is present.  Cardiovascular: Regular rhythm, normal heart sounds and intact distal pulses.   tachycardic  Pulmonary/Chest:  Tachypneic. Shallow breathing. Lung sound diminished and difficult to auscultate   Abdominal: Soft. He exhibits distension. There is tenderness.  Abdomen is distended but soft and generally tender without guarding. There is an area of erythema in periumbilical area spreading over ~6cmx4cm and has been marked with a skin marker. This area is warm to touch.  Musculoskeletal:  No LE edema  Lymphadenopathy:    He has no cervical adenopathy.  Neurological: He is alert and oriented to person, place, and time. No  cranial nerve deficit.  Skin: Skin is warm and dry.  Skin very dry, prolonged cap refill  Psychiatric: He has a normal mood and affect.  Nursing note and vitals reviewed.    ED Treatments / Results  DIAGNOSTIC STUDIES: Oxygen Saturation is 97% on [place] @ 3 L/m, adequate by my interpretation.    COORDINATION OF CARE: 8:23 PM Discussed treatment plan with pt at bedside which includes DG Chest, CBC with Differential and Comprehensive Metabolic Panel and pt agreed to plan.  Labs (all labs ordered are listed, but only abnormal results are displayed) Labs Reviewed  COMPREHENSIVE METABOLIC PANEL - Abnormal; Notable for the following:       Result Value   Sodium 123 (*)    Chloride 92 (*)    CO2 21 (*)    Calcium 7.1 (*)    Total Protein 5.4 (*)  Albumin 1.3 (*)    AST 130 (*)    Alkaline Phosphatase 238 (*)    Total Bilirubin 5.6 (*)    All other components within normal limits  CBC WITH DIFFERENTIAL/PLATELET - Abnormal; Notable for the following:    WBC 13.6 (*)    RBC 3.95 (*)    Hemoglobin 12.5 (*)    HCT 34.4 (*)    MCHC 36.3 (*)    RDW 21.7 (*)    Neutro Abs 12.3 (*)    Lymphs Abs 0.3 (*)    All other components within normal limits  BODY FLUID CELL COUNT WITH DIFFERENTIAL - Abnormal; Notable for the following:    Monocyte-Macrophage-Serous Fluid 49 (*)    All other components within normal limits  I-STAT CG4 LACTIC ACID, ED - Abnormal; Notable for the following:    Lactic Acid, Venous 3.62 (*)    All other components within normal limits  BODY FLUID CULTURE  CULTURE, BLOOD (ROUTINE X 2)  CULTURE, BLOOD (ROUTINE X 2)  ALBUMIN, FLUID  LACTATE DEHYDROGENASE, BODY FLUID  AMYLASE, PERITONEAL FLUID  GLUCOSE, SEROUS FLUID  PROTEIN, BODY FLUID  PATHOLOGIST SMEAR REVIEW  I-STAT CG4 LACTIC ACID, ED    EKG  EKG Interpretation  Date/Time:  Thursday March 15 2016 20:11:03 EDT Ventricular Rate:  117 PR Interval:    QRS Duration: 84 QT Interval:  322 QTC  Calculation: 450 R Axis:   -26 Text Interpretation:  Junctional tachycardia Low voltage, extremity and precordial leads agree. no STEMI. Confirmed by Johnney Killian, MD, Jeannie Done [phone]) on 03/15/2016 8:17:11 PM       Radiology Dg Chest Portable 1 View  Result Date: 03/15/2016 CLINICAL DATA:  Shortness of breath.  Metastatic pancreatic cancer. EXAM: PORTABLE CHEST 1 VIEW COMPARISON:  Most recent radiographs 02/18/2016, most recent CT 02/17/2016 FINDINGS: Moderate right pleural effusion, layering posteriorly. Development of moderate to large left pleural effusion tracking up the lateral chest wall. Associated volume loss in the lower lobes consistent with atelectasis. Heart size and mediastinal contours are grossly unchanged. Hazy opacity in the upper abdomen consistent with ascites. IMPRESSION: 1. Development of moderate to large left pleural effusion. 2. Moderate-sized right pleural effusion again seen. Electronically Signed   By: Jeb Levering M.D.   On: 03/15/2016 21:54    Procedures Procedures (including critical care time)  Medications Ordered in ED Medications  lidocaine (PF) (XYLOCAINE) 1 % injection 30 mL (not administered)  sodium chloride 0.9 % bolus 1,000 mL (0 mLs Intravenous Stopped 03/15/16 2130)  sodium chloride 0.9 % bolus 1,000 mL (0 mLs Intravenous Stopped 03/15/16 2230)  0.9 %  sodium chloride infusion ( Intravenous New Bag/Given 03/16/16 0156)  cefTRIAXone (ROCEPHIN) 2 g in dextrose 5 % 50 mL IVPB (2 g Intravenous New Bag/Given 03/16/16 0222)     Initial Impression / Assessment and Plan / ED Course  I have reviewed the triage vital signs and the nursing notes.  Pertinent labs & imaging results that were available during my care of the patient were reviewed by me and considered in my medical decision making (see chart for details).  Clinical Course   9:09 PM Discussed case with my attending physician Dr. Johnney Killian. Kevonne Diefenbach is an 45 y.o. male with metastatic  pancreatic cancer, now on hospice and DNR, here with recurrence of ascites that his wife feels is causing his progression of generalized weakness, confusion, and difficulty breathing over the last three weeks. Communication is a bit difficult due to language barrier even with  use of medical translator. Pt has declined all cancer treatments. However, it seems that pt's wife is amenable for hospital admission if pt would require IV antibiotics and/or IV fluids. At this point we will check labs and plan on performing paracentesis in the ED, then reassess.     3:52 AM Bedside paracentesis was performed by Dr. Ernie Hew. Please see her note for further detail. ~500cc of fluid was collected. Pt does feel somewhat improved and clinically is more alert but still unwell appearing and intermittently tachypneic. Given abdominal wall cellulitis with concerns for possible SBP our plan had been to initiate antibiotic therapy and consult the hospitalist team for admission. I spoke with the pharmacist as there is a shortage of cefotaxime and decided on rocephin as best choice for abx coverage. Ultimately fluid showed no evidence of SBP. I spoke with Dr. Tamala Julian of the hospitalist service. At this point without SBP there is not really any indication for admission given that pt is in hospice care. Per Dr. Tamala Julian, we can complete dose of rocephin in the ED and send home with rx for PO antibiotics and have pt f/u with hospice team in the morning. I discussed this plan with pt and his wife who verbalized their understanding. ER return precautions given.   Final Clinical Impressions(s) / ED Diagnoses   Final diagnoses:  Shortness of breath  Generalized weakness  Abdominal wall cellulitis  Failure to thrive in adult    New Prescriptions New Prescriptions   CEPHALEXIN (KEFLEX) 500 MG CAPSULE    Take 1 capsule (500 mg total) by mouth 4 (four) times daily.   I personally performed the services described in this  documentation, which was scribed in my presence. The recorded information has been reviewed and is accurate.    Anne Ng, PA-C 03/16/16 [id number]    Charlesetta Shanks, MD 03/16/16 [phone]

## 2016-03-15 NOTE — ED Triage Notes (Signed)
Pt presents BIB by [facility] with complaint of shortness of breath.  Hospice patient.  Hospice MD was called by the hospice nurse due to ascites and shortness of breath. Nurse was advised to send the pt to the emergency room for evaluation.  Has history of same.  Using interpreter and with the help of the pt's wife they report shortness of breath since last night.  Gross ascites noted.  No pain at this time.

## 2016-03-16 LAB — BODY FLUID CELL COUNT WITH DIFFERENTIAL
LYMPHS FL: 45 %
MONOCYTE-MACROPHAGE-SEROUS FLUID: 49 % — AB (ref 50–90)
Neutrophil Count, Fluid: 6 % (ref 0–25)
Total Nucleated Cell Count, Fluid: 94 cu mm (ref 0–1000)

## 2016-03-16 LAB — PROTEIN, BODY FLUID

## 2016-03-16 LAB — ALBUMIN, FLUID (OTHER): Albumin, Fluid: <1 g/dL

## 2016-03-16 LAB — GLUCOSE, SEROUS FLUID: Glucose, Fluid: 38 mg/dL

## 2016-03-16 LAB — I-STAT CG4 LACTIC ACID, ED: LACTIC ACID, VENOUS: 3.62 mmol/L — AB (ref 0.5–1.9)

## 2016-03-16 LAB — LACTATE DEHYDROGENASE, PLEURAL OR PERITONEAL FLUID: LD FL: 134 U/L — AB (ref 3–23)

## 2016-03-16 LAB — PATHOLOGIST SMEAR REVIEW

## 2016-03-16 MED ORDER — SODIUM CHLORIDE 0.9 % IV SOLN
Freq: Once | INTRAVENOUS | Status: AC
  Administered 2016-03-16: 02:00:00 via INTRAVENOUS

## 2016-03-16 MED ORDER — CEPHALEXIN 500 MG PO CAPS: 500.0000 mg | ORAL_CAPSULE | Freq: Four times a day (QID) | ORAL | 0 refills | Status: AC

## 2016-03-16 MED ORDER — DEXTROSE 5 % IV SOLN
2.0000 g | Freq: Once | INTRAVENOUS | Status: AC
  Administered 2016-03-16: 2 g via INTRAVENOUS
  Filled 2016-03-16: qty 2

## 2016-03-16 NOTE — ED Notes (Signed)
[facility] called for transport.  

## 2016-03-16 NOTE — Discharge Instructions (Signed)
We helped Keith Hart take some fluid out of his abdomen in the emergency room today. There does not appear to be infection of the fluid. However, he does have an infection of the skin of his abdomen that he will need to take antibiotics for. We spoke with the hospital doctors who recommend that Keith Hart goes home and follows up with his hospice doctor in the morning. Return to the emergency room for new or worsening symptoms.

## 2016-03-17 LAB — AMYLASE, PERITONEAL FLUID: Amylase, peritoneal fluid: 13 U/L

## 2016-03-19 LAB — BODY FLUID CULTURE: Culture: NO GROWTH

## 2016-03-21 LAB — CULTURE, BLOOD (ROUTINE X 2)
CULTURE: NO GROWTH
Culture: NO GROWTH

## 2016-04-01 DEATH — deceased

## 2016-09-27 IMAGING — CR DG ABDOMEN 2V
2 series · 2 of 2 positions shown · non-contrast
Comparison: CT of the abdomen and pelvis 12/27/2015

CLINICAL DATA: Nausea vomiting and generalized abdominal pain.

EXAM:
ABDOMEN - 2 VIEW

[w abdomen upright]
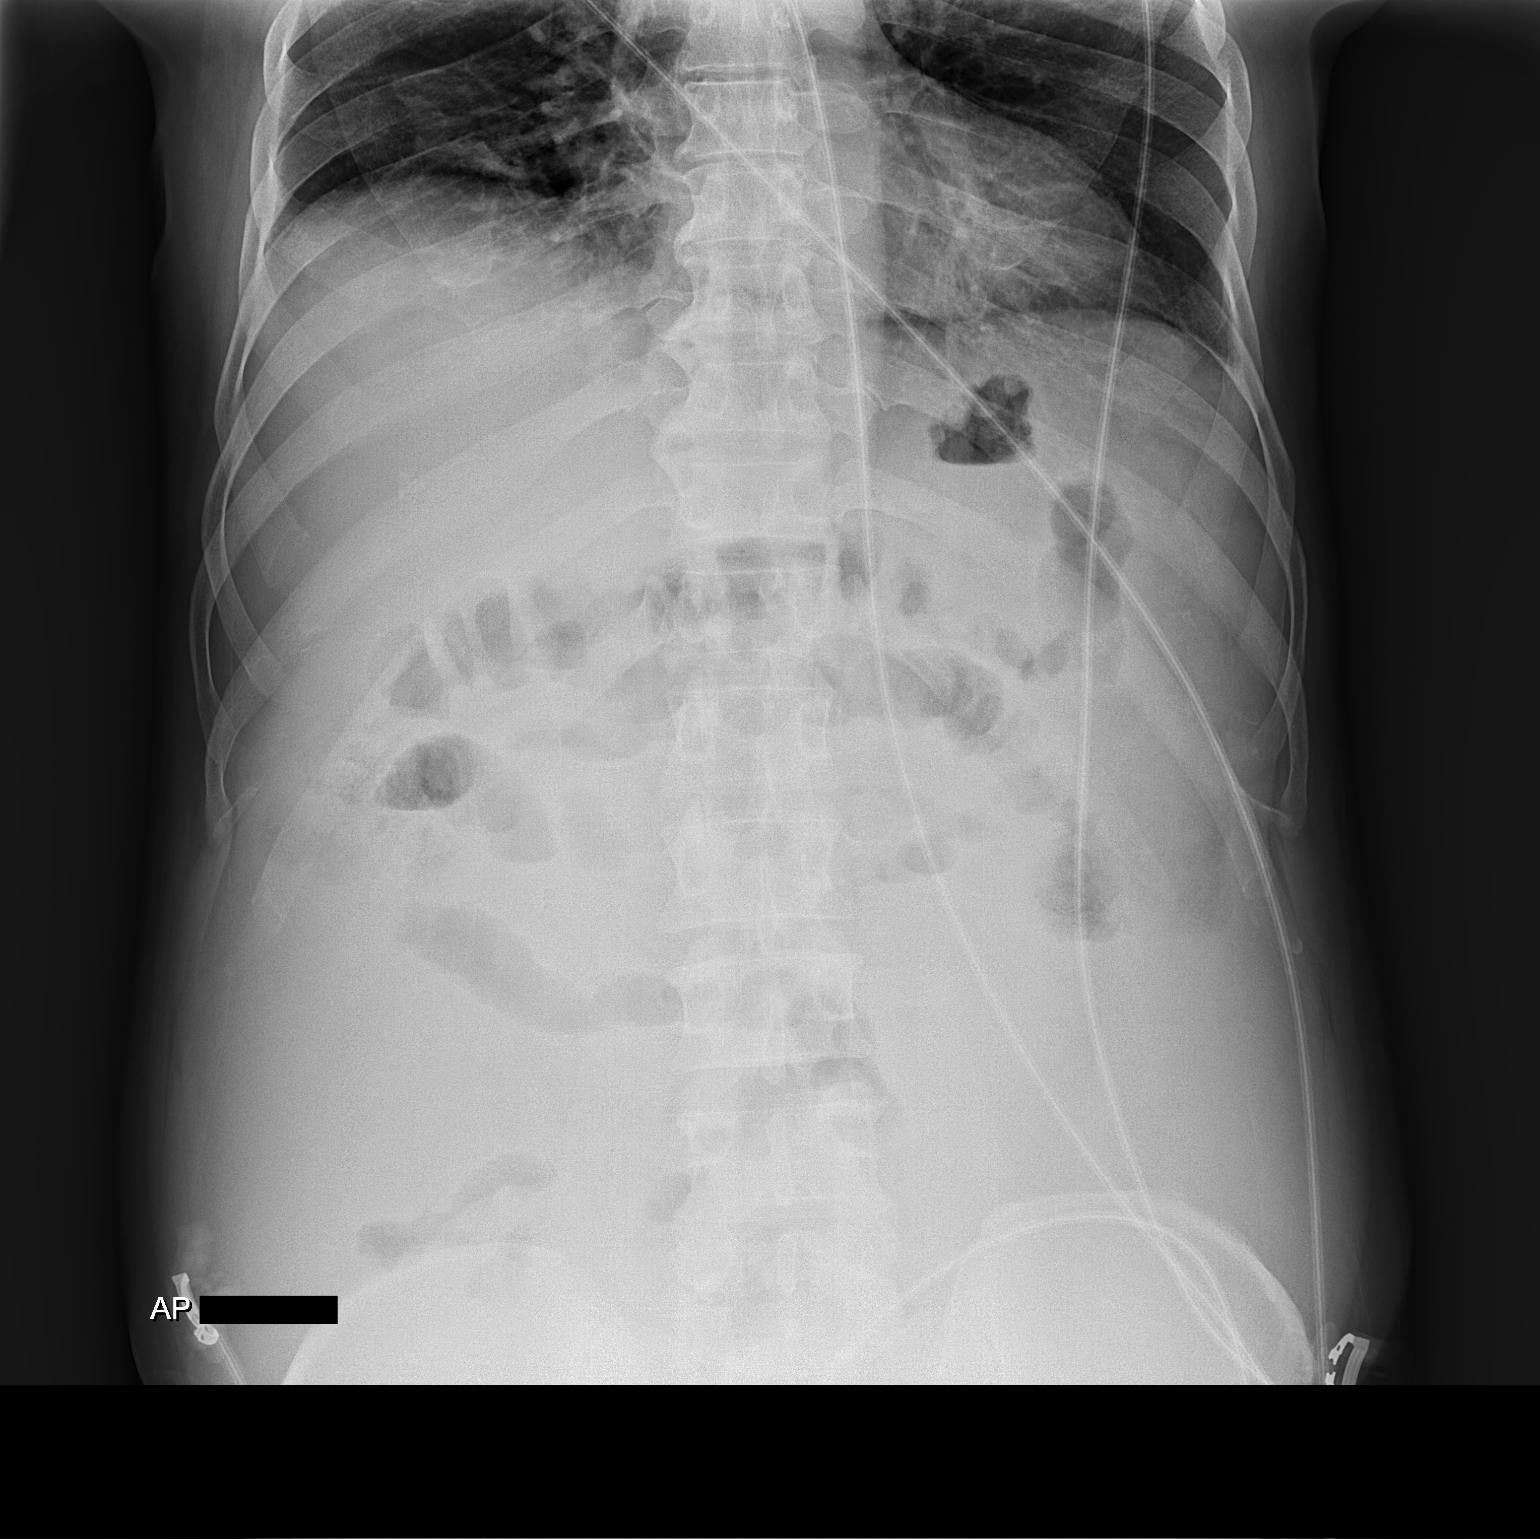

[t abdomen supine]
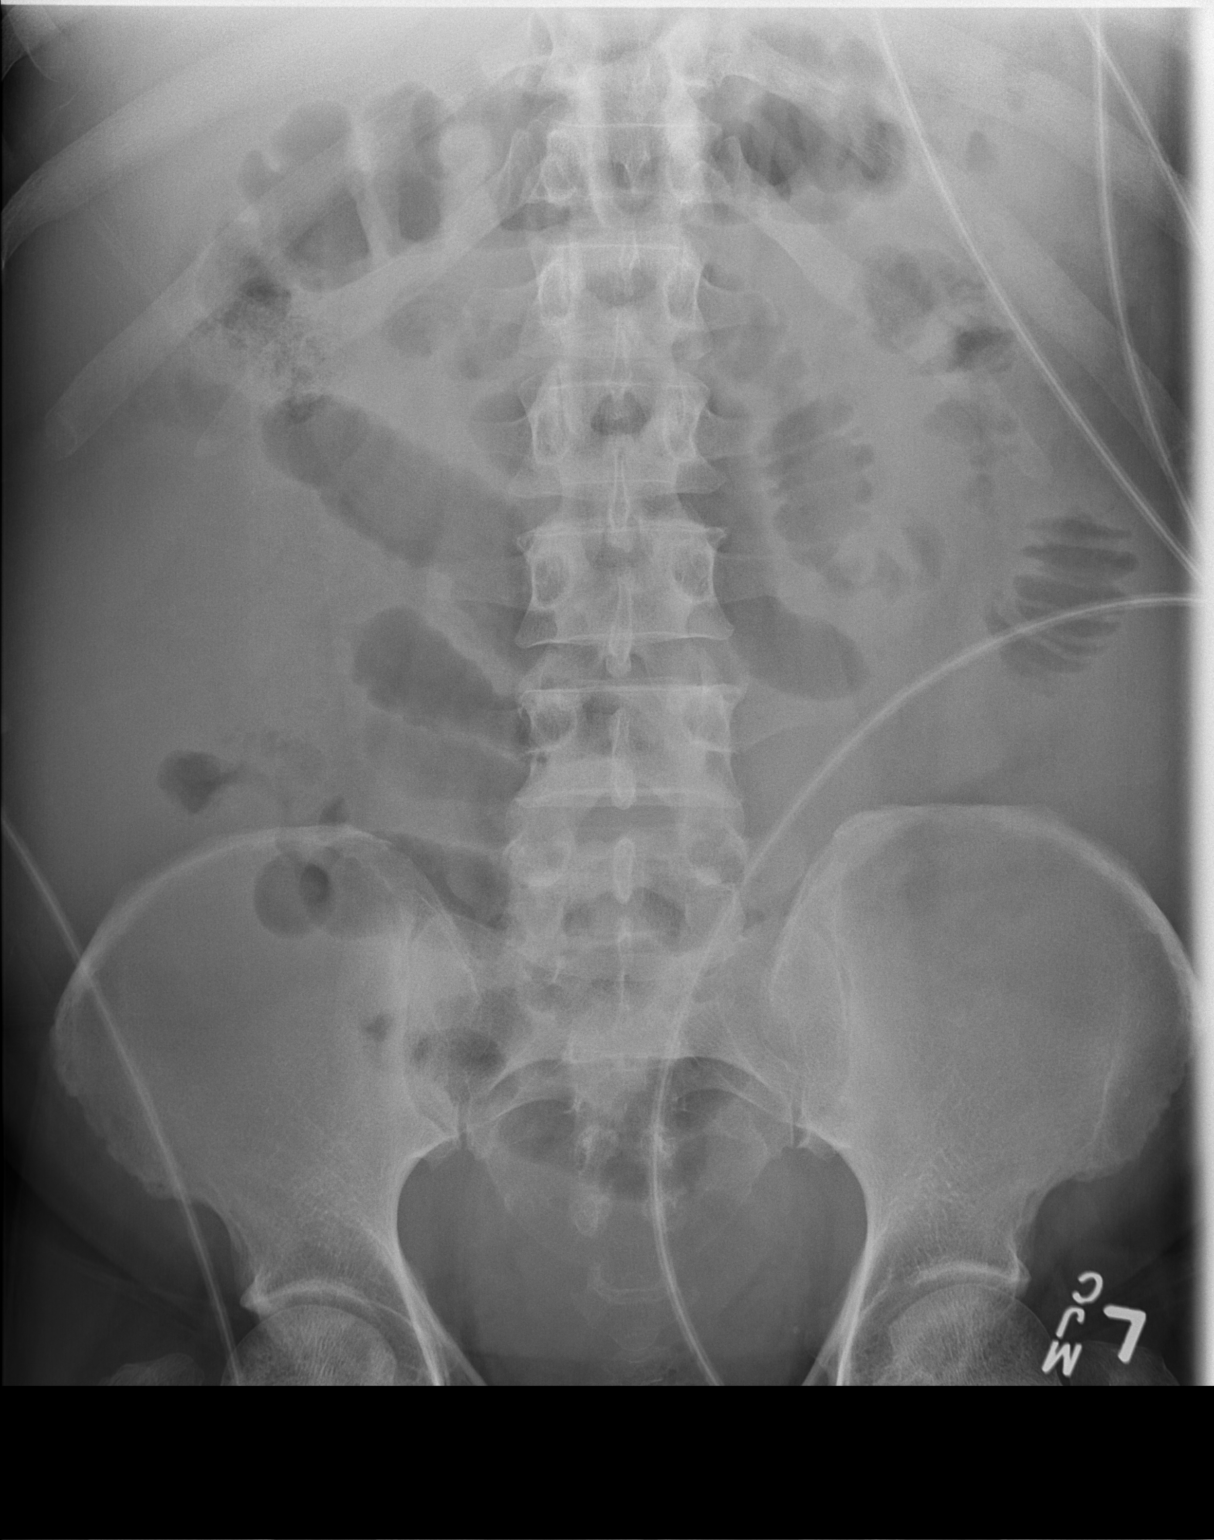

[2 of 2 positions shown; findings below may reference images not displayed]

FINDINGS: The bowel gas pattern is nonobstructive, with clustering of small
bowel loops in the center of the abdomen, consistent with presence
of moderate amount of ascites. There is relative paucity of gas in
the colon. No evidence of organomegaly.
IMPRESSION: Nonobstructive bowel gas pattern.

Moderate amount of abdominal ascites.

## 2016-10-23 IMAGING — DX DG CHEST 1V PORT SAME DAY
1 series · 1 of 1 positions shown · non-contrast
Comparison: 02/18/2016

CLINICAL DATA: Shortness of breath and right-sided chest pain,
recent thoracentesis

EXAM:
PORTABLE CHEST 1 VIEW

[chest ap]
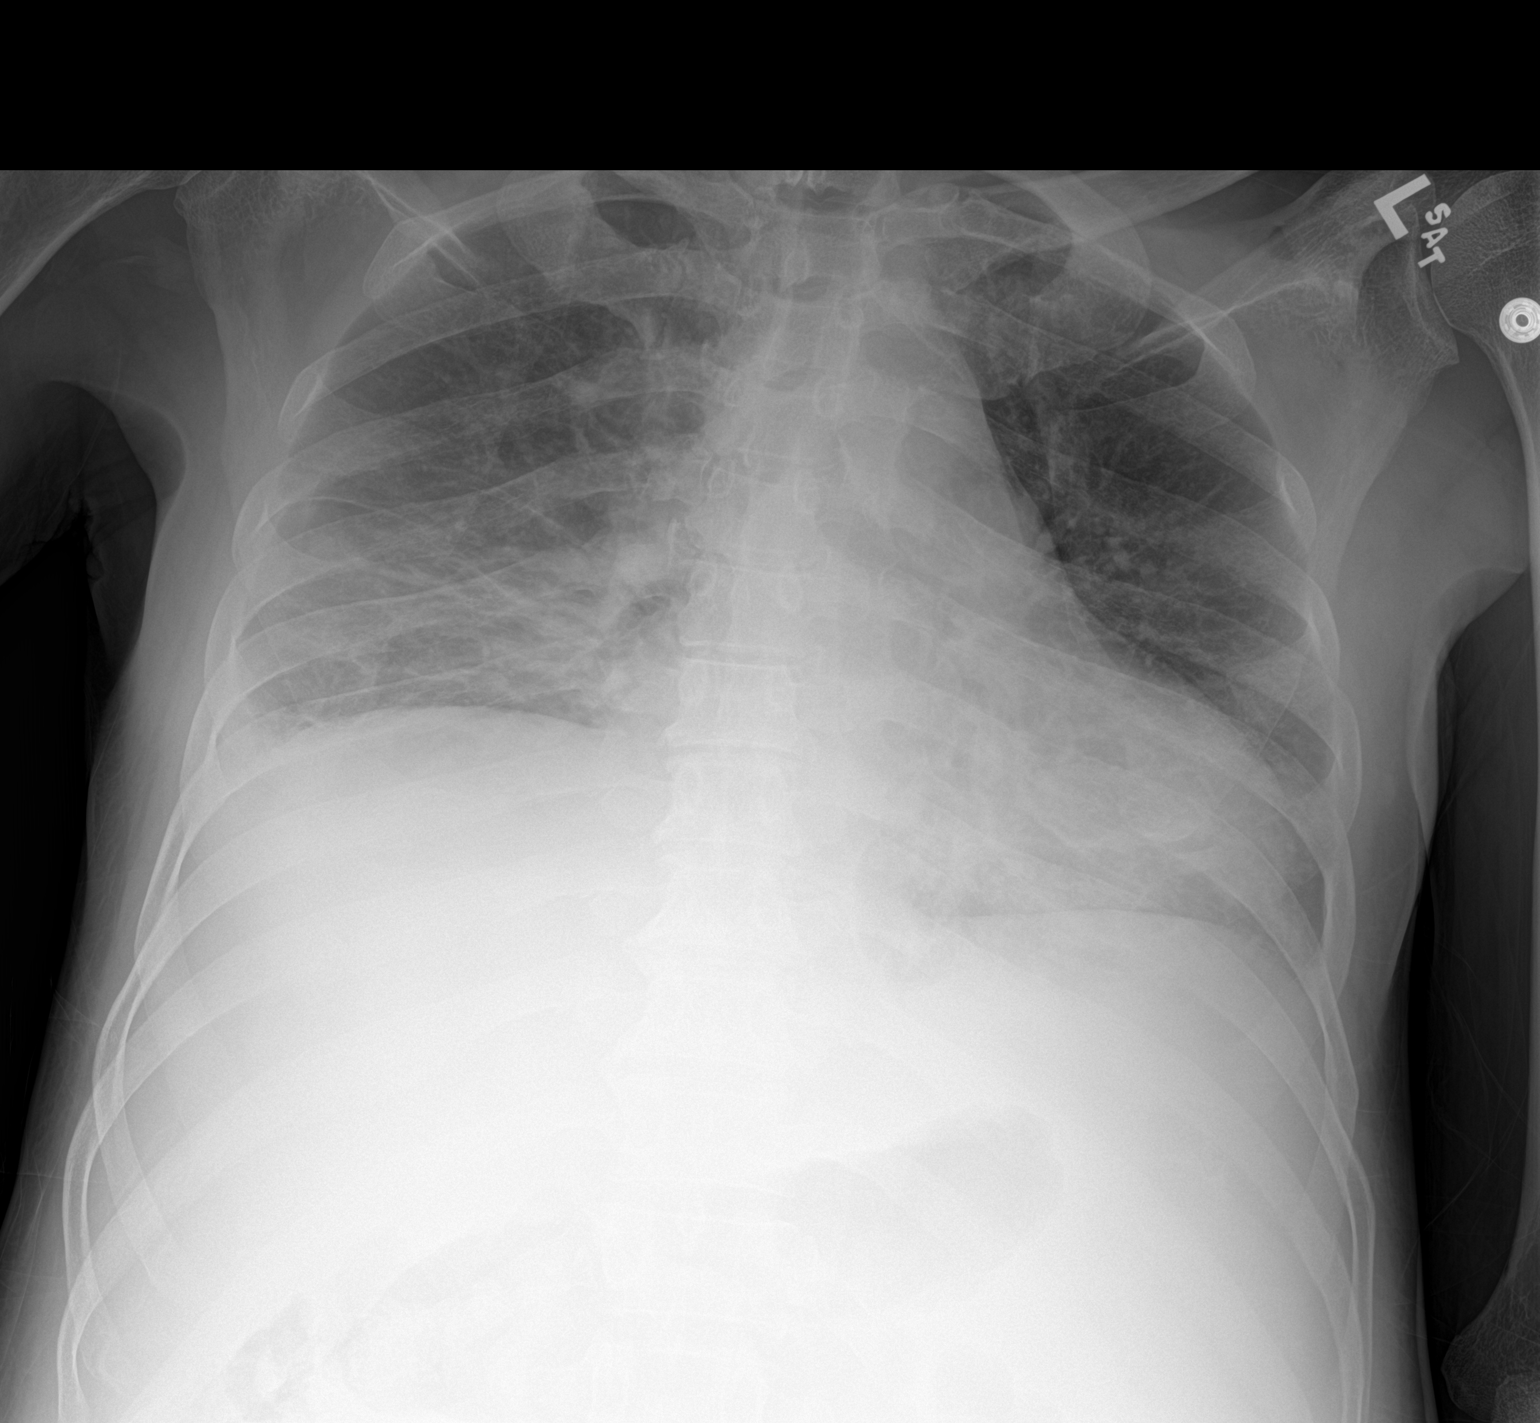

[1 of 1 positions shown; findings below may reference images not displayed]

FINDINGS: Cardiac shadow is stable. The overall inspiratory effort is poor. No
pneumothorax is seen. Right basilar atelectasis is noted which may
be in part due to the poor inspiratory effort. Is small pleural
effusion remains following thoracentesis. Some of the density in the
right lung base may be related to some re-expansion edema. No bony
abnormality is seen.
IMPRESSION: Minimal right pleural effusion.

Changes in the right base are noted consistent with a combination of
atelectasis and possibly re-expansion edema. No pneumothorax is
noted to correspond with the patient's chest pain.
